# Patient Record
Sex: Female | Born: 1935 | Race: White | Hispanic: No | Marital: Married | Smoking: Former smoker
Health system: Southern US, Community
[De-identification: ages and names within clinical notes are randomized; demographics above are authoritative.]

## PROBLEM LIST (undated history)

## (undated) ENCOUNTER — Emergency Department (HOSPITAL_COMMUNITY): Discharge status: Absent without leave | Payer: Self-pay

## (undated) DIAGNOSIS — R112 Nausea with vomiting, unspecified: Secondary | ICD-10-CM

## (undated) DIAGNOSIS — I209 Angina pectoris, unspecified: Secondary | ICD-10-CM

## (undated) DIAGNOSIS — C801 Malignant (primary) neoplasm, unspecified: Secondary | ICD-10-CM

## (undated) DIAGNOSIS — M1990 Unspecified osteoarthritis, unspecified site: Secondary | ICD-10-CM

## (undated) DIAGNOSIS — I341 Nonrheumatic mitral (valve) prolapse: Secondary | ICD-10-CM

## (undated) DIAGNOSIS — Z9889 Other specified postprocedural states: Secondary | ICD-10-CM

## (undated) HISTORY — PX: CHOLECYSTECTOMY: SHX55

## (undated) HISTORY — PX: TONSILLECTOMY: SUR1361

## (undated) HISTORY — PX: ABDOMINAL HYSTERECTOMY: SHX81

## (undated) HISTORY — DX: Malignant (primary) neoplasm, unspecified: C80.1

## (undated) HISTORY — PX: BREAST SURGERY: SHX581

---

## 2004-07-23 ENCOUNTER — Ambulatory Visit: Payer: Self-pay | Admitting: Family Medicine

## 2004-07-29 ENCOUNTER — Ambulatory Visit (HOSPITAL_COMMUNITY): Admission: RE | Admit: 2004-07-29 | Discharge: 2004-07-29 | Payer: Self-pay | Admitting: Family Medicine

## 2011-09-27 ENCOUNTER — Encounter (HOSPITAL_COMMUNITY): Payer: Self-pay | Admitting: Pharmacy Technician

## 2011-10-03 NOTE — Patient Instructions (Addendum)
22 Lauren Bruce  10/03/2011   Your procedure is scheduled on:  10/11/2011  Report to [facility] at  815  AM.  Call this number if you have problems the morning of surgery: [phone]   Remember:   Do not eat food:After Midnight.  May have clear liquids:until Midnight .   Take these medicines the morning of surgery with A SIP OF WATER: none   Do not wear jewelry, make-up or nail polish.  Do not wear lotions, powders, or perfumes. You may wear deodorant.  Do not shave 48 hours prior to surgery. Men may shave face and neck.  Do not bring valuables to the hospital.  Contacts, dentures or bridgework may not be worn into surgery.  Leave suitcase in the car. After surgery it may be brought to your room.  For patients admitted to the hospital, checkout time is 11:00 AM the day of discharge.   Patients discharged the day of surgery will not be allowed to drive home.  Name and phone number of your driver: family  Special Instructions: CHG Shower Use Special Wash: 1/2 bottle night before surgery and 1/2 bottle morning of surgery.   Please read over the following fact sheets that you were given: Pain Booklet, MRSA Information, Surgical Site Infection Prevention, Anesthesia Post-op Instructions and Care and Recovery After Surgery

## 2011-10-03 NOTE — Patient Instructions (Addendum)
20 Lauren Bruce  10/03/2011   Your procedure is scheduled on:   10/11/2011   Report to [facility] at  815  AM.  Call this number if you have problems the morning of surgery: [phone]   Remember:   Do not eat food:After Midnight.  May have clear liquids:until Midnight .  Clear liquids include soda, tea, black coffee, apple or grape juice, broth.  Take these medicines the morning of surgery with A SIP OF WATER: none   Do not wear jewelry, make-up or nail polish.  Do not wear lotions, powders, or perfumes. You may wear deodorant.  Do not shave 48 hours prior to surgery. Men may shave face and neck.  Do not bring valuables to the hospital.  Contacts, dentures or bridgework may not be worn into surgery.  Leave suitcase in the car. After surgery it may be brought to your room.  For patients admitted to the hospital, checkout time is 11:00 AM the day of discharge.   Patients discharged the day of surgery will not be allowed to drive home.  Name and phone number of your driver: family  Special Instructions: CHG Shower Use Special Wash: 1/2 bottle night before surgery and 1/2 bottle morning of surgery.   Please read over the following fact sheets that you were given: Pain Booklet, MRSA Information, Surgical Site Infection Prevention, Anesthesia Post-op Instructions and Care and Recovery After Surgery Magnesium Citrate oral solution What is this medicine? MAGNESIUM CITRATE (mag NEE zee um SI treyt) is a saline laxative. It is used to treat occasional constipation, but it should not be used regularly for this purpose. This medicine may be used for other purposes; ask your health care provider or pharmacist if you have questions. What should I tell my health care provider before I take this medicine? They need to know if you have any of these conditions: -are on a low magnesium or low sodium diet -change in bowel habits for 2 weeks -colostomy or ileostomy -constipation after  using another laxative for 7 days -diabetes -kidney disease -rectal bleeding -stomach pain, nausea, or vomiting -an unusual or allergic reaction to magnesium citrate, other magnesium products, other medicines, foods, dyes, or preservatives -pregnant or trying to get pregnant -breast-feeding How should I use this medicine? Take this medicine by mouth. Follow the directions on the package or prescription label. Use a specially marked spoon or container to measure each dose. Ask your pharmacist if you do not have one. Household spoons are not accurate. Drink a full glass of fluid with each dose of this medicine. This medicine may taste better if it is chilled before you drink it. Do not take your medicine more often than directed. Talk to your pediatrician regarding the use of this medicine in children. While this drug may be prescribed for children as young as 40 years of age for selected conditions, precautions do apply. Overdosage: If you think you have taken too much of this medicine contact a poison control center or emergency room at once. NOTE: This medicine is only for you. Do not share this medicine with others. What if I miss a dose? This does not apply; this medicine is not for regular use. What may interact with this medicine? -cellulose sodium phosphate -digoxin -edetate disodium, EDTA -medicines for bone strength like etidronate, ibandronate, risedronate -sodium polystyrene sulfonate -some antibiotics like ciprofloxacin, doxycycline, gatifloxacin, levofloxacin, tetracycline -vitamin D This list may not describe all possible interactions.  Give your health care provider a list of all the medicines, herbs, non-prescription drugs, or dietary supplements you use. Also tell them if you smoke, drink alcohol, or use illegal drugs. Some items may interact with your medicine. What should I watch for while using this medicine? Tell your doctor or healthcare professional if your symptoms do  not start to get better or if they get worse. Do not take any other medicine by mouth within 2 hours of taking this medicine. What side effects may I notice from receiving this medicine? Side effects that you should report to your doctor or health care professional as soon as possible: -allergic reactions like skin rash, itching or hives, swelling of the face, lips, or tongue -breathing problems -chest pain -fast, irregular heartbeat -muscle weakness -nausea or vomiting Side effects that usually do not require medical attention (report to your doctor or health care professional if they continue or are bothersome): -diarrhea -stomach upset This list may not describe all possible side effects. Call your doctor for medical advice about side effects. You may report side effects to FDA at 1-800-FDA-1088. Where should I keep my medicine? Keep out of the reach of children. Store at room temperature or in the refrigerator between 8 and 30 degrees C (46 and 86 degrees F). Throw away any unused medicine 24 hours after opening the bottle. Throw away unopened bottles of medicine after the expiration date. NOTE: This sheet is a summary. It may not cover all possible information. If you have questions about this medicine, talk to your doctor, pharmacist, or health care provider.  2012, [employer]. (10/09/2007 5:27:50 PM) Rectocele/Enterocele Care After A woman's birth canal (vagina) can become weak or stretched. This can be caused by childbirth, heavy lifting, lasting (chronic) constipation, aging, or pelvic surgery. When the vagina is weak and stretched, parts of the intestine can bulge into the vagina by pushing against the vaginal walls. A rectocele is when the very end of the large intestine (rectum) causes the bulge. An enterocele is when the small intestine causes the bulge. Surgery to fix this problem is usually done through the vagina. If you just had this surgery, you were probably given a  drug to make you sleep (general anesthetic) or a drug that numbs you from the waist down (spinal/epidural). Here is what happened:  The small intestine or rectum was pushed back to its normal place.   The vaginal wall was made stronger. Sometimes this is done with stitches or a mesh-like material.  HOME CARE INSTRUCTIONS  Some women go home the same day as their surgery. Others stay in the hospital for a few days. This depends on the size and type of repair.  Pain and Medications  Some pain is normal after this surgery. Only take pain medicine your surgeon prescribed. Follow the directions carefully.   Do not take aspirin. It can cause bleeding.   Do not drink alcohol while taking pain medication.   You may be given a medicine (antibiotic) that kills germs. Follow the directions carefully.   Take warm sitz baths 2 times a day to control discomfort and reduce any swelling. Take sitz baths with your caregiver's permission.  Diet  Go back to your normal eating as directed by your caregiver.   Drink a lot of fluids. Drink at least 6 glasses of water every day.  Activity  Move around and walk as much as possible. This can keep blood clots from forming in your legs.   Do  not climb stairs until your caregiver says it is okay.   Do not lift objects 5 pounds (2.3 kg) or heavier. Do not bend or strain for 6 to 8 weeks.   Do not drive until after you stop taking pain medicine and your caregiver says it is okay.   Your return to work will depend on the type of work you do. Ask your caregiver what is best for you.   Ask your caregiver when you can resume sexual activity. Most women can start having sex in about 6 weeks after their surgery.   Get plenty of rest during the day and sleep at night.   Have someone help you with your household chores and activities for 3 to 4 weeks.  Other Precautions  You may have some discharge from the vagina for a few weeks after the surgery. It may have  small amounts of blood in it. This is normal. If you have questions, ask your caregiver.   Do not use tampons or douche.   You should be able to take a shower a day after your surgery. Do not take a tub bath for at least a week.   Take it easy for awhile. You should feel much better in 2 to 3 weeks. It may take up to 6 weeks to feel completely normal.   Keep all follow-up appointments.   Take your temperature twice a day and write it down.   Make sure your family understands everything about your surgery and recovery.  SEEK MEDICAL CARE IF:   You have any questions about your medication, or you need stronger pain medication.   Pain continues, even after taking pain medication.   You become constipated.   You have an oral temperature above 102 F (38.9 C).   You develop swelling and redness in the surgery area.   You become dizzy or lightheaded.   You feel sick to your stomach (nauseous), throw up (vomit), or have diarrhea.   You develop a rash.   You have a reaction to your medications.  SEEK IMMEDIATE MEDICAL CARE IF:   Pain gets worse.   You have new bleeding from your vagina.   Discharge from the vagina becomes heavy, or it has a bad smell.   You have an oral temperature above 102 F (38.9 C), not controlled by medicine.   You develop belly (abdominal) pain.   You develop chest pain.   You develop shortness of breath.   You pass out (faint).   You develop pain, swelling, or redness in the leg.   You have pain or burning with urination.   You have bloody urine or cannot urinate.  MAKE SURE YOU:   Understand these instructions.   Will watch your condition.   Will get help right away if you are not doing well or get worse.  Document Released: 06/15/2009 Document Revised: 03/10/2011 Document Reviewed: 06/15/2009 [facility] Patient Information 2012 [place], [place].Cystocele Repair A cystocele is a bulging, drooping hernia or break (rupture) of bladder  tissue into the birth canal (vagina). This bulging or rupture occurs on the top front wall of the vagina. CAUSES  Cystocele is associated with weakness of the top front wall of the vagina due to stretching and tearing of the ligaments and muscles in the area. This is often the result of:  Multiple childbirths.   Continuous heavy lifting.   Chronic cough from asthma, emphysema, or smoking.   Being overweight.   Changes from aging.  Previous surgery in the vaginal area.   Menopause with loss of estrogen hormone and weakening of the ligaments and muscles around the bladder.  SYMPTOMS   Uncontrolled loss of urine (incontinence) with cough, sneeze, or exercise.   Pelvic pressure.   Frequency or urgency to urinate because of inability to completely empty the bladder.   Bladder infections.   Needing to push on the upper vagina to help yourself pass urine.  DIAGNOSIS  A cystocele can be diagnosed by doing a pelvic exam and observing the top of the vagina drooping or bulging into or out of the vagina. TREATMENT  Surgical options:  Cystocele repair is surgery that removes the hernia.   There are also different "sling" operations that may be used.  Discuss the different types of surgeries to repair a cystocele with your caregiver. Your caregiver will decide what type of surgery will be best in your case. Nonsurgical options:  Kegel exercises. This helps strengthen and tighten the muscles and tissue in and around the bladder and vagina. This may help with mild cases of cystocele.   A pessary may help the cystocele. A pessary is a plastic or rubber device that lifts the bladder into place. A pessary must be fitted by a doctor.   Tampons or diaphragms that lift the bladder into place are sometimes helpful with a minor or small cystocele.   Estrogen may help with mild cases in menopausal and aging women.  LET YOUR CAREGIVER KNOW ABOUT:   Allergies to food or medicine.   Medicines  taken, including vitamins, herbs, eyedrops, over-the-counter medicines, and creams.   Use of steroids (by mouth or creams).   Previous problems with anesthetics or numbing medicines.   History of bleeding problems or blood clots.   Previous surgery.   Other health problems, including diabetes and kidney problems.   Possibility of pregnancy, if this applies.  RISKS AND COMPLICATIONS  All surgery is associated with risks.  There are risks with a general anesthesia. You should discuss this with your caregiver.   With spinal or epidural anesthesia, there may be an area that is not numbed, and you could feel pain.   Headache could occur with a spinal or epidural anesthetic.   The catheter you will have after surgery may not work properly or may get blocked and need to be replaced.   Excessive bleeding.   Infection.   Injury to surrounding structures.   Recurrence of the cystocele.   Surgery may not get rid of your symptoms.  BEFORE THE PROCEDURE   Do not take aspirin or blood thinners for 1 week prior to surgery, unless instructed otherwise.   Do not eat or drink anything after midnight the night before surgery.   Let your caregiver know if you develop a cold or other infectious problems prior to surgery.   If being admitted the day of surgery, you should be present 1 hour prior to your procedure or as directed by your caregiver.   Plan and arrange for help when you go home from the hospital.   If you smoke, do not smoke for at least 2 weeks before the surgery.   Do not drink any alcohol for 3 days before the surgery.  PROCEDURE  You will be given an anesthetic to prevent you from feeling pain during surgery. This may be a general anesthetic that puts you to sleep, or a spinal or epidural anesthetic. You will be asleep or be numbed through the entire procedure.  During cystocele repair, tissue is pulled from the sides and around the top of the vagina to lift up the hernia.  This removes the hernia so that the top of the vagina does not fall into the opening of the vagina. AFTER THE PROCEDURE  After surgery, you will be taken to the recovery room where a nurse will take care of you, checking your breathing, blood pressure, pulse, and your progress. When your caregiver feels you are stable, you will be taken to your room. You will have a drainage tube (Foley catheter) that will drain your bladder for 2 to 7 days or longer, until your bladder is working properly. This catheter is placed prior to surgery to help keep your bladder empty and out of the way during the procedure. After surgery, this will make passing your urine easier. The catheter will be removed when you can easily pass urine without this assistance. You may have gauze packing in the vagina that will be removed 1 to 2 days after the surgery. Usually, you will be given a medicine (antibiotic) that kills germs. You will be given pain medicine as needed. You can usually go home in 3 to 5 days. HOME CARE INSTRUCTIONS   Do not take baths. Take showers until your caregiver informs you otherwise.   Take antibiotics as directed by your caregiver.   Exercise as instructed. Do not perform exercises which increase the pressure inside your belly (abdomen), such as sit-ups or lifting weights, until your caregiver has given permission. Walking exercise is preferred.   Only take over-the-counter or prescription medicines for pain and discomfort as directed by your caregiver.   Do not drink alcohol while taking pain medicine.   Do not lift anything over 5 pounds.   Do not drive until your caregiver gives you permission.   Get plenty of rest and sleep.   Have someone help with your household chores for 1 to 2 weeks.   If you develop constipation, you may take a mild laxative with your caregiver's permission. Eating bran foods and drinking enough water and fluids to keep your urine clear or pale yellow helps with  constipation.   Do not take aspirin. It may cause bleeding.   You may resume normal diet and unstrenuous activities as directed.   Do not douche, use tampons, or engage in intercourse until your surgeon has given permission.   Change bandages (dressings) as directed.   Make and keep all your postoperative appointments.  SEEK MEDICAL CARE IF:   You have abnormal vaginal discharge.   You develop a rash.   You are having a reaction to your medicine.   You develop nausea or vomiting.  SEEK IMMEDIATE MEDICAL CARE IF:   You have redness, swelling, or increasing pain in the vaginal area.   You notice pus coming from the vagina.   You have a fever.   You notice a bad smell coming from the vagina.   You have increasing abdominal pain.   You have frequent urination or you notice burning during urination.   You notice blood in your urine.   You have excessive vaginal bleeding.   You cannot urinate.  MAKE SURE YOU:   Understand these instructions.   Will watch your condition.   Will get help right away if you are not doing well or get worse.  Document Released: 03/18/2000 Document Revised: 03/10/2011 Document Reviewed: 06/18/2009 [facility] Patient Information 2012 [place], [place].Hysterectomy Information  A hysterectomy is a procedure where your  uterus is surgically removed. It will no longer be possible to have menstrual periods or to become pregnant. The tubes and ovaries can be removed (bilateral salpingo-oopherectomy) during this surgery as well.  REASONS FOR A HYSTERECTOMY  Persistent, abnormal bleeding.   Lasting (chronic) pelvic pain or infection.   The lining of the uterus (endometrium) starts growing outside the uterus (endometriosis).   The endometrium starts growing in the muscle of the uterus (adenomyosis).   The uterus falls down into the vagina (pelvic organ prolapse).   Symptomatic uterine fibroids.   Precancerous cells.   Cervical cancer or uterine  cancer.  TYPES OF HYSTERECTOMIES  Supracervical hysterectomy. This type removes the top part of the uterus, but not the cervix.   Total hysterectomy. This type removes the uterus and cervix.   Radical hysterectomy. This type removes the uterus, cervix, and the fibrous tissue that holds the uterus in place in the pelvis (parametrium).  WAYS A HYSTERECTOMY CAN BE PERFORMED  Abdominal hysterectomy. A large surgical cut (incision) is made in the abdomen. The uterus is removed through this incision.   Vaginal hysterectomy. An incision is made in the vagina. The uterus is removed through this incision. There are no abdominal incisions.   Conventional laparoscopic hysterectomy. A thin, lighted tube with a camera (laparoscope) is inserted into 3 or 4 small incisions in the abdomen. The uterus is cut into small pieces. The small pieces are removed through the incisions, or they are removed through the vagina.   Laparoscopic assisted vaginal hysterectomy (LAVH). Three or four small incisions are made in the abdomen. Part of the surgery is performed laparoscopically and part vaginally. The uterus is removed through the vagina.   Robot-assisted laparoscopic hysterectomy. A laparoscope is inserted into 3 or 4 small incisions in the abdomen. A computer-controlled device is used to give the surgeon a 3D image. This allows for more precise movements of surgical instruments. The uterus is cut into small pieces and removed through the incisions or removed through the vagina.  RISKS OF HYSTERECTOMY   Bleeding and risk of blood transfusion. Tell your caregiver if you do not want to receive any blood products.   Blood clots in the legs or lung.   Infection.   Injury to surrounding organs.   Anesthesia problems or side effects.   Conversion to an abdominal hysterectomy.  WHAT TO EXPECT AFTER A HYSTERECTOMY  You will be given pain medicine.   You will need to have someone with you for the first 3 to 5  days after you go home.   You will need to follow up with your surgeon in 2 to 4 weeks after surgery to evaluate your progress.   You may have early menopause symptoms like hot flashes, night sweats, and insomnia.   If you had a hysterectomy for a problem that was not a cancer or a condition that could lead to cancer, then you no longer need Pap tests. However, even if you no longer need a Pap test, a regular exam is a good idea to make sure no other problems are starting.  Document Released: 09/14/2000 Document Revised: 03/10/2011 Document Reviewed: 10/30/2010 [facility] Patient Information 2012 [place], [place].PATIENT INSTRUCTIONS POST-ANESTHESIA  IMMEDIATELY FOLLOWING SURGERY:  Do not drive or operate machinery for the first twenty four hours after surgery.  Do not make any important decisions for twenty four hours after surgery or while taking narcotic pain medications or sedatives.  If you develop intractable nausea and vomiting or a severe  headache please notify your doctor immediately.  FOLLOW-UP:  Please make an appointment with your surgeon as instructed. You do not need to follow up with anesthesia unless specifically instructed to do so.  WOUND CARE INSTRUCTIONS (if applicable):  Keep a dry clean dressing on the anesthesia/puncture wound site if there is drainage.  Once the wound has quit draining you may leave it open to air.  Generally you should leave the bandage intact for twenty four hours unless there is drainage.  If the epidural site drains for more than 36-48 hours please call the anesthesia department.  QUESTIONS?:  Please feel free to call your physician or the hospital operator if you have any questions, and they will be happy to assist you.

## 2011-10-04 ENCOUNTER — Other Ambulatory Visit: Payer: Self-pay | Admitting: Obstetrics and Gynecology

## 2011-10-04 ENCOUNTER — Encounter (HOSPITAL_COMMUNITY): Payer: Self-pay

## 2011-10-04 ENCOUNTER — Encounter (HOSPITAL_COMMUNITY)
Admission: RE | Admit: 2011-10-04 | Discharge: 2011-10-04 | Disposition: A | Discharge status: Home / Self Care | Payer: Medicare Other | Source: Ambulatory Visit | Attending: Obstetrics and Gynecology | Admitting: Obstetrics and Gynecology

## 2011-10-04 HISTORY — DX: Nausea with vomiting, unspecified: R11.2

## 2011-10-04 HISTORY — DX: Other specified postprocedural states: Z98.890

## 2011-10-04 HISTORY — DX: Unspecified osteoarthritis, unspecified site: M19.90

## 2011-10-04 HISTORY — DX: Nonrheumatic mitral (valve) prolapse: I34.1

## 2011-10-04 LAB — BASIC METABOLIC PANEL
Calcium: 9.9 mg/dL (ref 8.4–10.5)
Chloride: 102 mEq/L (ref 96–112)
Creatinine, Ser: 0.94 mg/dL (ref 0.50–1.10)
GFR calc non Af Amer: 58 mL/min — ABNORMAL LOW (ref >90)
Glucose, Bld: 88 mg/dL (ref 70–99)
Potassium: 4.8 mEq/L (ref 3.5–5.1)

## 2011-10-04 LAB — SURGICAL PCR SCREEN
MRSA, PCR: NEGATIVE
Staphylococcus aureus: NEGATIVE

## 2011-10-04 LAB — CBC
Hemoglobin: 13.2 g/dL (ref 12.0–15.0)
MCH: 30.3 pg (ref 26.0–34.0)
Platelets: 289 10*3/uL (ref 150–400)
WBC: 7.2 10*3/uL (ref 4.0–10.5)

## 2011-10-04 LAB — DIFFERENTIAL
Basophils Absolute: 0 10*3/uL (ref 0.0–0.1)
Eosinophils Absolute: 0.2 10*3/uL (ref 0.0–0.7)
Eosinophils Relative: 3 % (ref 0–5)
Monocytes Absolute: 0.5 10*3/uL (ref 0.1–1.0)
Monocytes Relative: 7 % (ref 3–12)
Neutrophils Relative %: 50 % (ref 43–77)

## 2011-10-08 LAB — TYPE AND SCREEN
ABO/RH(D): A POS
Antibody Screen: NEGATIVE

## 2011-10-11 ENCOUNTER — Encounter (HOSPITAL_COMMUNITY): Payer: Self-pay | Admitting: Anesthesiology

## 2011-10-11 ENCOUNTER — Encounter (HOSPITAL_COMMUNITY)
Admission: RE | Disposition: A | Discharge status: Home / Self Care | Payer: Self-pay | Source: Ambulatory Visit | Attending: Obstetrics and Gynecology

## 2011-10-11 ENCOUNTER — Ambulatory Visit (HOSPITAL_COMMUNITY)
Admission: RE | Admit: 2011-10-11 | Discharge: 2011-10-12 | Disposition: A | Discharge status: Home / Self Care | Payer: Medicare Other | Source: Ambulatory Visit | Attending: Obstetrics and Gynecology | Admitting: Obstetrics and Gynecology

## 2011-10-11 ENCOUNTER — Inpatient Hospital Stay (HOSPITAL_COMMUNITY): Payer: Medicare Other | Admitting: Anesthesiology

## 2011-10-11 ENCOUNTER — Encounter (HOSPITAL_COMMUNITY): Payer: Self-pay

## 2011-10-11 ENCOUNTER — Encounter (HOSPITAL_COMMUNITY): Payer: Self-pay | Admitting: *Deleted

## 2011-10-11 DIAGNOSIS — IMO0002 Reserved for concepts with insufficient information to code with codable children: Secondary | ICD-10-CM

## 2011-10-11 DIAGNOSIS — Z01812 Encounter for preprocedural laboratory examination: Secondary | ICD-10-CM | POA: Insufficient documentation

## 2011-10-11 DIAGNOSIS — N814 Uterovaginal prolapse, unspecified: Secondary | ICD-10-CM | POA: Insufficient documentation

## 2011-10-11 DIAGNOSIS — N8189 Other female genital prolapse: Principal | ICD-10-CM | POA: Insufficient documentation

## 2011-10-11 DIAGNOSIS — Z01810 Encounter for preprocedural cardiovascular examination: Secondary | ICD-10-CM | POA: Insufficient documentation

## 2011-10-11 HISTORY — PX: SALPINGOOPHORECTOMY: SHX82

## 2011-10-11 HISTORY — PX: VAGINAL HYSTERECTOMY: SHX2639

## 2011-10-11 HISTORY — PX: ANTERIOR AND POSTERIOR REPAIR: SHX5121

## 2011-10-11 SURGERY — HYSTERECTOMY, VAGINAL
Anesthesia: Spinal | Laterality: Right | Wound class: Clean Contaminated

## 2011-10-11 MED ORDER — FENTANYL CITRATE 0.05 MG/ML IJ SOLN
25.0000 ug | INTRAMUSCULAR | Status: DC | PRN
  Administered 2011-10-11 (×2): 50 ug via INTRAVENOUS

## 2011-10-11 MED ORDER — FENTANYL CITRATE 0.05 MG/ML IJ SOLN
INTRAMUSCULAR | Status: AC
  Filled 2011-10-11: qty 2

## 2011-10-11 MED ORDER — PANTOPRAZOLE SODIUM 40 MG PO TBEC
40.0000 mg | DELAYED_RELEASE_TABLET | Freq: Every day | ORAL | Status: DC
  Administered 2011-10-11: 40 mg via ORAL
  Filled 2011-10-11: qty 1

## 2011-10-11 MED ORDER — FENTANYL CITRATE 0.05 MG/ML IJ SOLN
INTRAMUSCULAR | Status: DC | PRN
  Administered 2011-10-11: 25 ug via INTRATHECAL

## 2011-10-11 MED ORDER — CEFAZOLIN SODIUM-DEXTROSE 2-3 GM-% IV SOLR: 2.0000 g | INTRAVENOUS | Status: DC

## 2011-10-11 MED ORDER — BUPIVACAINE IN DEXTROSE 0.75-8.25 % IT SOLN
INTRATHECAL | Status: DC | PRN
  Administered 2011-10-11: 13 mg via INTRATHECAL

## 2011-10-11 MED ORDER — BUPIVACAINE HCL (PF) 0.5 % IJ SOLN
INTRAMUSCULAR | Status: DC | PRN
  Administered 2011-10-11: 1 mL
  Administered 2011-10-11: 16 mL

## 2011-10-11 MED ORDER — KETOROLAC TROMETHAMINE 30 MG/ML IJ SOLN: 30.0000 mg | Freq: Four times a day (QID) | INTRAMUSCULAR | Status: AC

## 2011-10-11 MED ORDER — CEFAZOLIN SODIUM 1-5 GM-% IV SOLN: 1.0000 g | INTRAVENOUS | Status: DC

## 2011-10-11 MED ORDER — ZOLPIDEM TARTRATE 5 MG PO TABS
5.0000 mg | ORAL_TABLET | Freq: Every evening | ORAL | Status: DC | PRN
Start: 2011-10-11 — End: 2011-10-12

## 2011-10-11 MED ORDER — KETOROLAC TROMETHAMINE 30 MG/ML IJ SOLN
INTRAMUSCULAR | Status: AC
  Administered 2011-10-11: 30 mg via INTRAVENOUS
  Filled 2011-10-11: qty 1

## 2011-10-11 MED ORDER — EPHEDRINE SULFATE 50 MG/ML IJ SOLN
INTRAMUSCULAR | Status: AC
  Filled 2011-10-11: qty 1

## 2011-10-11 MED ORDER — HYDROMORPHONE HCL PF 1 MG/ML IJ SOLN
0.2000 mg | INTRAMUSCULAR | Status: DC | PRN
  Administered 2011-10-11: 0.5 mg via INTRAVENOUS
  Administered 2011-10-12: 0.4 mg via INTRAVENOUS
  Filled 2011-10-11 (×2): qty 1

## 2011-10-11 MED ORDER — ONDANSETRON HCL 4 MG PO TABS: 4.0000 mg | ORAL_TABLET | Freq: Four times a day (QID) | ORAL | Status: DC | PRN

## 2011-10-11 MED ORDER — 0.9 % SODIUM CHLORIDE (POUR BTL) OPTIME
TOPICAL | Status: DC | PRN
  Administered 2011-10-11: 1000 mL

## 2011-10-11 MED ORDER — KCL IN DEXTROSE-NACL 20-5-0.45 MEQ/L-%-% IV SOLN
INTRAVENOUS | Status: DC
  Administered 2011-10-11: 125 mL/h via INTRAVENOUS
  Administered 2011-10-11: via INTRAVENOUS

## 2011-10-11 MED ORDER — EPHEDRINE SULFATE 50 MG/ML IJ SOLN
INTRAMUSCULAR | Status: DC | PRN
  Administered 2011-10-11: 5 mg via INTRAVENOUS
  Administered 2011-10-11: 10 mg via INTRAVENOUS

## 2011-10-11 MED ORDER — PROPOFOL 10 MG/ML IV EMUL
INTRAVENOUS | Status: DC | PRN
  Administered 2011-10-11: 25 ug/kg/min via INTRAVENOUS

## 2011-10-11 MED ORDER — DEXAMETHASONE SODIUM PHOSPHATE 4 MG/ML IJ SOLN
INTRAMUSCULAR | Status: AC
  Administered 2011-10-11: 4 mg via INTRAVENOUS
  Filled 2011-10-11: qty 1

## 2011-10-11 MED ORDER — MIDAZOLAM HCL 2 MG/2ML IJ SOLN
1.0000 mg | INTRAMUSCULAR | Status: DC | PRN
  Administered 2011-10-11: 1 mg via INTRAVENOUS

## 2011-10-11 MED ORDER — KETOROLAC TROMETHAMINE 30 MG/ML IJ SOLN
30.0000 mg | Freq: Four times a day (QID) | INTRAMUSCULAR | Status: AC
  Administered 2011-10-11 – 2011-10-12 (×3): 30 mg via INTRAVENOUS
  Filled 2011-10-11 (×3): qty 1

## 2011-10-11 MED ORDER — DOCUSATE SODIUM 100 MG PO CAPS
100.0000 mg | ORAL_CAPSULE | Freq: Two times a day (BID) | ORAL | Status: DC
  Administered 2011-10-12: 100 mg via ORAL
  Filled 2011-10-11 (×2): qty 1

## 2011-10-11 MED ORDER — BUPIVACAINE IN DEXTROSE 0.75-8.25 % IT SOLN
INTRATHECAL | Status: AC
  Filled 2011-10-11: qty 2

## 2011-10-11 MED ORDER — ONDANSETRON HCL 4 MG/2ML IJ SOLN: 4.0000 mg | Freq: Once | INTRAMUSCULAR | Status: DC | PRN

## 2011-10-11 MED ORDER — STERILE WATER FOR IRRIGATION IR SOLN
Status: DC | PRN
  Administered 2011-10-11: 1000 mL

## 2011-10-11 MED ORDER — PROPOFOL 10 MG/ML IV EMUL
INTRAVENOUS | Status: AC
  Filled 2011-10-11: qty 20

## 2011-10-11 MED ORDER — OXYCODONE-ACETAMINOPHEN 5-325 MG PO TABS: 1.0000 | ORAL_TABLET | ORAL | Status: DC | PRN

## 2011-10-11 MED ORDER — BUPIVACAINE HCL (PF) 0.5 % IJ SOLN
INTRAMUSCULAR | Status: AC
  Filled 2011-10-11: qty 30

## 2011-10-11 MED ORDER — ONDANSETRON HCL 4 MG/2ML IJ SOLN
4.0000 mg | Freq: Once | INTRAMUSCULAR | Status: AC
  Administered 2011-10-11: 4 mg via INTRAVENOUS

## 2011-10-11 MED ORDER — LIDOCAINE HCL (PF) 1 % IJ SOLN
INTRAMUSCULAR | Status: AC
  Filled 2011-10-11: qty 5

## 2011-10-11 MED ORDER — ONDANSETRON HCL 4 MG/2ML IJ SOLN
INTRAMUSCULAR | Status: AC
  Administered 2011-10-11: 4 mg via INTRAVENOUS
  Filled 2011-10-11: qty 2

## 2011-10-11 MED ORDER — MIDAZOLAM HCL 2 MG/2ML IJ SOLN
INTRAMUSCULAR | Status: AC
  Filled 2011-10-11: qty 2

## 2011-10-11 MED ORDER — CLOTRIMAZOLE-BETAMETHASONE 1-0.05 % EX CREA: 1.0000 "application " | TOPICAL_CREAM | Freq: Two times a day (BID) | CUTANEOUS | Status: DC | PRN

## 2011-10-11 MED ORDER — ONDANSETRON HCL 4 MG/2ML IJ SOLN
4.0000 mg | Freq: Four times a day (QID) | INTRAMUSCULAR | Status: DC | PRN
  Administered 2011-10-11 – 2011-10-12 (×2): 4 mg via INTRAVENOUS
  Filled 2011-10-11 (×2): qty 2

## 2011-10-11 MED ORDER — KETOROLAC TROMETHAMINE 30 MG/ML IJ SOLN
30.0000 mg | Freq: Once | INTRAMUSCULAR | Status: AC
  Administered 2011-10-11: 30 mg via INTRAVENOUS

## 2011-10-11 MED ORDER — CEFAZOLIN SODIUM 1-5 GM-% IV SOLN
INTRAVENOUS | Status: DC | PRN
  Administered 2011-10-11: 2 g via INTRAVENOUS

## 2011-10-11 MED ORDER — LACTATED RINGERS IV SOLN
INTRAVENOUS | Status: DC
  Administered 2011-10-11: 11:00:00 via INTRAVENOUS
  Administered 2011-10-11: 1000 mL via INTRAVENOUS

## 2011-10-11 MED ORDER — SIMETHICONE 80 MG PO CHEW: 80.0000 mg | CHEWABLE_TABLET | Freq: Four times a day (QID) | ORAL | Status: DC | PRN

## 2011-10-11 MED ORDER — DEXAMETHASONE SODIUM PHOSPHATE 4 MG/ML IJ SOLN
4.0000 mg | Freq: Once | INTRAMUSCULAR | Status: AC
  Administered 2011-10-11: 4 mg via INTRAVENOUS

## 2011-10-11 MED ORDER — CEFAZOLIN SODIUM-DEXTROSE 2-3 GM-% IV SOLR
INTRAVENOUS | Status: AC
  Filled 2011-10-11: qty 50

## 2011-10-11 SURGICAL SUPPLY — 40 items
BAG HAMPER (MISCELLANEOUS) ×3 IMPLANT
BLADE SURG ROTATE 9660 (MISCELLANEOUS) IMPLANT
CLOTH BEACON ORANGE TIMEOUT ST (SAFETY) ×3 IMPLANT
COVER LIGHT HANDLE STERIS (MISCELLANEOUS) ×6 IMPLANT
COVER MAYO STAND XLG (DRAPE) ×3 IMPLANT
DECANTER SPIKE VIAL GLASS SM (MISCELLANEOUS) ×3 IMPLANT
DRAPE PROXIMA HALF (DRAPES) ×3 IMPLANT
DRAPE STERI URO 9X17 APER PCH (DRAPES) ×3 IMPLANT
ELECT REM PT RETURN 9FT ADLT (ELECTROSURGICAL) ×3
ELECTRODE REM PT RTRN 9FT ADLT (ELECTROSURGICAL) ×2 IMPLANT
FORMALIN 10 PREFIL 480ML (MISCELLANEOUS) ×3 IMPLANT
GAUZE PACKING 2X5 YD STERILE (GAUZE/BANDAGES/DRESSINGS) ×3 IMPLANT
GLOVE ECLIPSE 6.5 STRL STRAW (GLOVE) ×3 IMPLANT
GLOVE ECLIPSE 7.0 STRL STRAW (GLOVE) ×3 IMPLANT
GLOVE ECLIPSE 9.0 STRL (GLOVE) ×3 IMPLANT
GLOVE INDICATOR 6.5 STRL GRN (GLOVE) ×3 IMPLANT
GLOVE INDICATOR 7.5 STRL GRN (GLOVE) ×3 IMPLANT
GLOVE INDICATOR STER SZ 9 (GLOVE) ×3 IMPLANT
GOWN STRL REIN 3XL LVL4 (GOWN DISPOSABLE) ×3 IMPLANT
GOWN STRL REIN XL XLG (GOWN DISPOSABLE) ×6 IMPLANT
IV NS IRRIG 3000ML ARTHROMATIC (IV SOLUTION) ×3 IMPLANT
KIT ROOM TURNOVER AP CYSTO (KITS) ×3 IMPLANT
MANIFOLD NEPTUNE II (INSTRUMENTS) ×3 IMPLANT
NEEDLE HYPO 25X1 1.5 SAFETY (NEEDLE) ×3 IMPLANT
NS IRRIG 1000ML POUR BTL (IV SOLUTION) ×3 IMPLANT
PACK PERI GYN (CUSTOM PROCEDURE TRAY) ×3 IMPLANT
PAD ARMBOARD 7.5X6 YLW CONV (MISCELLANEOUS) ×3 IMPLANT
SET BASIN LINEN APH (SET/KITS/TRAYS/PACK) ×3 IMPLANT
SET IV ADMIN VERSALIGHT (MISCELLANEOUS) ×3 IMPLANT
SUT CHROMIC 0 CT 1 (SUTURE) ×30 IMPLANT
SUT CHROMIC 2 0 CT 1 (SUTURE) ×12 IMPLANT
SUT CHROMIC GUT BROWN 0 54 (SUTURE) IMPLANT
SUT CHROMIC GUT BROWN 0 54IN (SUTURE)
SUT PROLENE 2 0 SH 30 (SUTURE) ×3 IMPLANT
SUT VIC AB 0 CT2 8-18 (SUTURE) ×3 IMPLANT
SYR CONTROL 10ML LL (SYRINGE) ×3 IMPLANT
TRAY FOLEY BAG SILVER LF 14FR (CATHETERS) IMPLANT
TRAY FOLEY CATH 14FR (SET/KITS/TRAYS/PACK) ×3 IMPLANT
VERSALIGHT (MISCELLANEOUS) ×3 IMPLANT
WATER STERILE IRR 1000ML POUR (IV SOLUTION) ×3 IMPLANT

## 2011-10-11 NOTE — Anesthesia Procedure Notes (Addendum)
Date/Time: 10/11/2011 8:07 AM Performed by: Despina Hidden   Spinal  Patient location during procedure: OR Start time: 10/11/2011 8:07 AM Staffing CRNA/Resident: Joycelyn Man J Preanesthetic Checklist Completed: patient identified, site marked, surgical consent, pre-op evaluation, timeout performed, IV checked, risks and benefits discussed and monitors and equipment checked Spinal Block Patient position: right lateral decubitus Prep: Betadine Patient monitoring: heart rate, cardiac monitor, continuous pulse ox and blood pressure Approach: right paramedian Location: L2-3 Injection technique: single-shot Needle Needle type: Spinocan  Needle gauge: 22 G Assessment Sensory level: T8 Additional Notes CSF clear and brisk     [id number]       09/2012

## 2011-10-11 NOTE — Op Note (Signed)
See the detailed operative notes included in the brief operative

## 2011-10-11 NOTE — Progress Notes (Signed)
Dr. Emelda Fear notified of pt's c/o ringworm to posterior left thigh, and that pt applied antifungal cream to affected area.  No new orders received.  Will cont to monitor.

## 2011-10-11 NOTE — H&P (Signed)
Lauren Bruce is an 78 y.o. female. She is admitted for Vaginal hysterectomy, Anterior and Posterior repair for pelvic relaxation with second degree uterine descensus and cystocele.  Pelvic ultrasound shows a tiny uterus, with a thin endometrial stripe 1.9 mm thickness, no suspicion of uterine pathology, tiny atrophic ovaries, and no abnormalities other than support issues.  She denies stress incontinence.  Pertinent Gynecological History: Menses: post-menopausal Bleeding: none Contraception: none DES exposure: unknown Blood transfusions: none Sexually transmitted diseases: no past history Previous GYN Procedures: none  Last mammogram: done thru Dr Lauren Bruce office Date: last yr. Last pap: normal Date: March 2013, Dr nyland OB History: G4, P4   Menstrual History: Menarche age: n/a No LMP recorded.    Past Medical History  Diagnosis Date  . PONV (postoperative nausea and vomiting)   . Mitral valve prolapse   . Skin cancer of face   . Arthritis     Past Surgical History  Procedure Date  . Tonsillectomy   . Breast surgery     benign, right, [place], [place]  . Cholecystectomy     [place], [place]    No family history on file.  Social History:  reports that she has quit smoking. She does not have any smokeless tobacco history on file. She reports that she does not drink alcohol or use illicit drugs.  Allergies:  Allergies  Allergen Reactions  . Demerol (Meperidine) Shortness Of Breath  . Xylocaine (Lidocaine) Shortness Of Breath    No prescriptions prior to admission    ROS  There were no vitals taken for this visit. Physical Exam Physical Examination: General appearance - alert, well appearing, and in no distress, oriented to person, place, and time, normal appearing weight and well hydrated Mental status - alert, oriented to person, place, and time, normal mood, behavior, speech, dress, motor activity, and thought processes Chest - clear to auscultation, no  wheezes, rales or rhonchi, symmetric air entry Abdomen - soft, nontender, nondistended, no masses or organomegaly Pelvic - VULVA: normal appearing vulva with no masses, tenderness or lesions, VAGINA: normal appearing vagina with normal color and discharge, no lesions, PELVIC FLOOR EXAM: cystocele to introitus, uterine descensus grade 2, CERVIX: normal appearing cervix without discharge or lesions, descensus , UTERUS: mobile, atrophic 2 descensus, ADNEXA: normal adnexa in size, nontender and no masses, RECTAL: normal rectal, no masses, posterior vaginal support better than anterior , will reinforce to aid support to anterior and apical surgery. Extremities - Homan's sign negative bilaterally No results found for this or any previous visit (from the past 24 hour(s)).  No results found.  Assessment/Plan: Pelvic relaxation with cystocele, uterine descensus second degree,  Plan:  Vaginal hysterectomy anterior and posterior repair.  [name] V 10/11/2011, 3:21 AM

## 2011-10-11 NOTE — Anesthesia Postprocedure Evaluation (Signed)
  Anesthesia Post-op Note  Patient: Lauren Bruce  Procedure(s) Performed: Procedure(s) (LRB): HYSTERECTOMY VAGINAL (N/A) ANTERIOR (CYSTOCELE) AND POSTERIOR REPAIR (RECTOCELE) (N/A) SALPINGO OOPHERECTOMY (Right)  Patient Location: PACU  Anesthesia Type: Spinal  Level of Consciousness: awake, alert , oriented and patient cooperative  Airway and Oxygen Therapy: Patient Spontanous Breathing  Post-op Pain: none  Post-op Assessment: Post-op Vital signs reviewed, Patient's Cardiovascular Status Stable, Respiratory Function Stable, Patent Airway, No signs of Nausea or vomiting and Pain level controlled  Post-op Vital Signs: Reviewed and stable  Complications: No apparent anesthesia complications

## 2011-10-11 NOTE — Anesthesia Preprocedure Evaluation (Signed)
Anesthesia Evaluation  Patient identified by MRN, date of birth, ID band Patient awake    Reviewed: Allergy & Precautions, H&P , Patient's Chart, lab work & pertinent test results  History of Anesthesia Complications (+) PONV  Airway Mallampati: I      Dental  (+) Teeth Intact and Implants   Pulmonary neg pulmonary ROS, former smoker,  breath sounds clear to auscultation        Cardiovascular + Valvular Problems/Murmurs MVP Rhythm:Regular     Neuro/Psych    GI/Hepatic   Endo/Other    Renal/GU      Musculoskeletal   Abdominal   Peds  Hematology   Anesthesia Other Findings   Reproductive/Obstetrics                           Anesthesia Physical Anesthesia Plan  ASA: II  Anesthesia Plan: Spinal   Post-op Pain Management:    Induction:   Airway Management Planned: Nasal Cannula  Additional Equipment:   Intra-op Plan:   Post-operative Plan:   Informed Consent: I have reviewed the patients History and Physical, chart, labs and discussed the procedure including the risks, benefits and alternatives for the proposed anesthesia with the patient or authorized representative who has indicated his/her understanding and acceptance.     Plan Discussed with:   Anesthesia Plan Comments:         Anesthesia Quick Evaluation

## 2011-10-11 NOTE — Anesthesia Postprocedure Evaluation (Addendum)
  Anesthesia Post-op Note  Patient: PHOENYX MELKA  Procedure(s) Performed: Procedure(s) (LRB): HYSTERECTOMY VAGINAL (N/A) ANTERIOR (CYSTOCELE) AND POSTERIOR REPAIR (RECTOCELE) (N/A) SALPINGO OOPHERECTOMY (Right)  Patient Location: PACU  Anesthesia Type: Spinal  Level of Consciousness: awake, alert , oriented and patient cooperative  Airway and Oxygen Therapy: Patient Spontanous Breathing  Post-op Pain: none  Post-op Assessment: Post-op Vital signs reviewed, Patient's Cardiovascular Status Stable, Respiratory Function Stable, Patent Airway and Pain level controlled  Post-op Vital Signs: Reviewed and stable  Complications: No apparent anesthesia complications 10/12/11  Patient doing well.  Pain controlled.  No apparent anesthesia complications.

## 2011-10-11 NOTE — Brief Op Note (Signed)
10/11/2011  10:18 AM  PATIENT:  Lauren Bruce  78 y.o. female  PRE-OPERATIVE DIAGNOSIS:  cystocele pelvic relaxation  POST-OPERATIVE DIAGNOSIS:  cystocele pelvic relaxation, uterine descensus second degree  PROCEDURE:  Procedure(s) (LRB): HYSTERECTOMY VAGINAL (N/A) ANTERIOR (CYSTOCELE) AND POSTERIOR REPAIR (RECTOCELE) (N/A) SALPINGO OOPHERECTOMY (Right)  SURGEON:  Surgeon(s) and Role:    * Tilda Burrow, MD - Primary  PHYSICIAN ASSISTANT:   ASSISTANTS: Irena Reichmann RN, D Blackwell CST   ANESTHESIA:   local and spinal  EBL:  Total I/O In: 900 [I.V.:900] Out: -   BLOOD ADMINISTERED:none  DRAINS: Urinary Catheter (Foley)   LOCAL MEDICATIONS USED:  MARCAINE    and Amount: 13 ml  SPECIMEN:  Source of Specimen:  uterus, cervix, Right tube and ovary, vaginal epithelium  DISPOSITION OF SPECIMEN:  PATHOLOGY  COUNTS:  YES  TOURNIQUET:  * No tourniquets in log *  DICTATION: .Dragon Dictation She was taken to the operating room prepped and draped for vaginal procedure after spinal anesthesia introduced timeout was conducted and procedure confirmed by involved surgical team. Ancef was administered preoperatively in length placed in high lithotomy support. The patient had some range her bowel prep which required cleansing prior to prepping and draping. A vaginal tape was placed in the rectum at the onset of the procedure. Cervix was grasped with single-tooth tenaculum, retracted inferiorly and and posterior colpotomy incision performed without difficulty the cervix extended past the introitus. Her cycle ligaments were clamped cut and suture ligated using Zeppelin clamps Mayo scissors and 0 chromics suture ligature which was tagged. Short speculum was replaced with the long curved weighted speculum, extending into the cul-de-sac. The cervix was circumscribed through the epithelium and the bladder retracted anteriorly and the anterior cervicovaginal reflection of peritoneum easily  identified and entered. Lower cardinal ligaments were taken down in small bites clamping cutting and suture ligating using Zeppelin clamps Mayo scissors and 0 chromics suture ligature. Broad ligaments were taken down similarly in small bites reaching to the level of the ligament utero-ovarian ligament complex, which could be crossclamped and tagged. The right tube and ovary could be easily isolated, while bowel was held away by moist vaginal tapes inside the abdomen. The right infundibulopelvic ligament was clamped cut and suture ligated and tagged for hemostasis and inspection. The left tube and ovary were more adherent to the pelvic sidewall and could not be isolated adequately for extraction. It was no abnormality suspected on the side. Bladder was drained, clear urine obtained 300 cc, and the vesicouterine reflection of peritoneum attached to the cul-de-sac. Prior to closure of the cuff 0 Prolene suture was used attaching the medial side of the uterosacral ligament to the pouch of Douglas each side, not crossing the midline,in order to reduce the potential for subsequent enterocele. Once peritoneum was closed anterior repair was initiated.  Anterior repair consisted of splitting vaginal epithelium underneath the large cystocele, peeling the bladder away, shortening the vaginal epithelium under the cystocele with 0 Vicryl mattress sutures x3, trimming redundant epithelium and using a running 2-0 chromic suture to reapproximate the tissue in the midline and then using 0 chromic to attach the anterior margin of the cystocele to the vaginal cuff with good tissue approximation achieved.  Posterior repair: Posterior repair consisted of grasping the perineal body it with Allis clamps, identifying a post Patrick laceration defect to the right of the midline, splitting the epithelium in the midline and exposing the tube and beneath the epithelium on side sufficiently to do a  site-specific repair which consisted of 3  sutures of 0 Vicryl pulling the tissue on the patient's left side to the right and cephalad 2 more reapproximate original anatomy. Redundant epithelium was trimmed and 2-0 chromic used to close the posterior defect. Patient tolerated procedure well with 100 cc EBL and went to recovery room in good condition with sponge and needle counts correct.  PLAN OF CARE: Admit for overnight observation  PATIENT DISPOSITION:  PACU - hemodynamically stable.   Delay start of Pharmacological VTE agent (>24hrs) due to surgical blood loss or risk of bleeding: not applicable

## 2011-10-11 NOTE — Addendum Note (Signed)
Addendum  created 10/11/11 1028 by Despina Hidden, CRNA   Modules edited:Notes Section

## 2011-10-11 NOTE — Transfer of Care (Signed)
Immediate Anesthesia Transfer of Care Note  Patient: Lauren Bruce  Procedure(s) Performed: Procedure(s) (LRB): HYSTERECTOMY VAGINAL (N/A) ANTERIOR (CYSTOCELE) AND POSTERIOR REPAIR (RECTOCELE) (N/A) SALPINGO OOPHERECTOMY (Right)  Patient Location: PACU  Anesthesia Type: Spinal  Level of Consciousness: awake and patient cooperative  Airway & Oxygen Therapy: Patient Spontanous Breathing and Patient connected to face mask oxygen  Post-op Assessment: Report given to PACU RN and Post -op Vital signs reviewed and stable  Post vital signs: Reviewed and stable  Complications: No apparent anesthesia complications

## 2011-10-12 LAB — CBC
HCT: 33.2 % — ABNORMAL LOW (ref 36.0–46.0)
Hemoglobin: 11.1 g/dL — ABNORMAL LOW (ref 12.0–15.0)
MCH: 30.1 pg (ref 26.0–34.0)
MCHC: 33.4 g/dL (ref 30.0–36.0)
MCV: 90 fL (ref 78.0–100.0)
RDW: 12.3 % (ref 11.5–15.5)

## 2011-10-12 LAB — BASIC METABOLIC PANEL
BUN: 7 mg/dL (ref 6–23)
Calcium: 9 mg/dL (ref 8.4–10.5)
Creatinine, Ser: 0.78 mg/dL (ref 0.50–1.10)
GFR calc Af Amer: >90 mL/min (ref >90)
GFR calc non Af Amer: 80 mL/min — ABNORMAL LOW (ref >90)
Glucose, Bld: 135 mg/dL — ABNORMAL HIGH (ref 70–99)
Potassium: 4.3 mEq/L (ref 3.5–5.1)

## 2011-10-12 MED ORDER — HYDROCODONE-ACETAMINOPHEN 5-500 MG PO TABS: 1.0000 | ORAL_TABLET | ORAL | Status: AC | PRN

## 2011-10-12 MED ORDER — DSS 100 MG PO CAPS: 100.0000 mg | ORAL_CAPSULE | Freq: Two times a day (BID) | ORAL | Status: AC

## 2011-10-12 NOTE — Addendum Note (Signed)
Addendum  created 10/12/11 0841 by Moshe Salisbury, CRNA   Modules edited:Notes Section

## 2011-10-12 NOTE — Addendum Note (Signed)
Addendum  created 10/12/11 0839 by Moshe Salisbury, CRNA   Modules edited:Notes Section

## 2011-10-12 NOTE — Addendum Note (Signed)
Addendum  created 10/12/11 0849 by Moshe Salisbury, CRNA   Modules edited:Notes Section

## 2011-10-12 NOTE — Discharge Summary (Signed)
Physician Discharge Summary  Patient ID: Lauren Bruce MRN: [id number] DOB/AGE: [birth year] 78 y.o.  Admit date: 10/11/2011 Discharge date: 10/12/2011  Admission Diagnoses:Active Problems:  Pelvic relaxation due to cystocele   Discharge Diagnoses:  Active Problems:  Pelvic relaxation due to cystocele   Discharged Condition: good  Hospital Course: Lauren Bruce is an 78 y.o. female. She is admitted for Vaginal hysterectomy, Anterior and Posterior repair for pelvic relaxation with second degree uterine descensus and cystocele.    Consults: None  Significant Diagnostic Studies: labs:  Hemoglobin & Hematocrit     Component Value Date/Time   HGB 11.1* 10/12/2011 0637   HCT 33.2* 10/12/2011 4098    Treatments: surgery:  PROCEDURE: Procedure(s) (LRB):  HYSTERECTOMY VAGINAL (N/A)  ANTERIOR (CYSTOCELE) AND POSTERIOR REPAIR (RECTOCELE) (N/A)  SALPINGO OOPHERECTOMY (Right)  Discharge Exam: Blood pressure 92/50, pulse 68, temperature 97.5 F (36.4 C), temperature source Oral, resp. rate 17, height 5\' 4"  (1.626 m), weight 70.6 kg (155 lb 10.3 oz), SpO2 98.00%. General appearance: alert, cooperative, appears stated age and no distress Chest wall: lungs clear GI: soft, non-tender; bowel sounds normal; no masses,  no organomegaly Extremities: Homans sign is negative, no sign of DVT  Disposition: Final discharge disposition not confirmed Discharge home  Medication List  As of 10/12/2011  8:45 AM   ASK your doctor about these medications         clotrimazole-betamethasone cream   Commonly known as: LOTRISONE   Apply 1 application topically 2 (two) times daily as needed. For ringworm      diphenhydrAMINE 25 MG tablet   Commonly known as: BENADRYL   Take 25 mg by mouth every 6 (six) hours as needed. For allergies      estrogens (conjugated) 0.625 MG tablet   Commonly known as: PREMARIN   Take 0.625 mg by mouth as directed. Until surgery. At bedtime      ibuprofen 200 MG tablet     Commonly known as: ADVIL,MOTRIN   Take 400 mg by mouth every 6 (six) hours as needed. For pain             Signed: Tilda Burrow 10/12/2011, 8:45 AM

## 2011-10-13 ENCOUNTER — Encounter (HOSPITAL_COMMUNITY): Payer: Self-pay | Admitting: Obstetrics and Gynecology

## 2013-03-06 ENCOUNTER — Encounter: Payer: Self-pay | Admitting: Obstetrics and Gynecology

## 2013-03-06 ENCOUNTER — Ambulatory Visit (INDEPENDENT_AMBULATORY_CARE_PROVIDER_SITE_OTHER): Payer: Medicare Other | Admitting: Obstetrics and Gynecology

## 2013-03-06 ENCOUNTER — Encounter (INDEPENDENT_AMBULATORY_CARE_PROVIDER_SITE_OTHER): Payer: Self-pay

## 2013-03-06 VITALS — BP 120/80 | Ht 63.0 in | Wt 156.0 lb

## 2013-03-06 DIAGNOSIS — Z01419 Encounter for gynecological examination (general) (routine) without abnormal findings: Secondary | ICD-10-CM

## 2013-03-06 DIAGNOSIS — Z124 Encounter for screening for malignant neoplasm of cervix: Secondary | ICD-10-CM

## 2013-03-06 LAB — HEMOCCULT GUIAC POC 1CARD (OFFICE)
Card #1 Date: 12032014
Fecal Occult Blood, POC: NEGATIVE

## 2013-03-06 MED ORDER — ZOLPIDEM TARTRATE 5 MG PO TABS: 5.0000 mg | ORAL_TABLET | Freq: Every evening | ORAL | Status: DC | PRN

## 2013-03-06 NOTE — Patient Instructions (Signed)
Dr nyland to see pt for routine annual concerns., including mammogram, and hemoccult Gyn anatomy visit q 3 yr.

## 2013-03-06 NOTE — Progress Notes (Signed)
   Assessment:  Annual Gyn Exam  good support s/p TVH ant and post repair Negative hemoccult Plan:  1. Pap not required s/ p hyst 2. return annually or prn 3    Annual mammogram advised Subjective:  Lauren Bruce is a 79 y.o. female No obstetric history on file. who presents for annual exam. Patient's last menstrual period was 10/04/2011. The patient has complaints today of left hip beginning to bother pt and keep her awake.   The following portions of the patient's history were reviewed and updated as appropriate: allergies, current medications, past family history, past medical history, past social history, past surgical history and problem list.  Review of Systems Constitutional: negative Gastrointestinal: negative Genitourinary:no sui sx  Objective:  BP 120/80  Ht 5\' 3"  (1.6 m)  Wt 156 lb (70.761 kg)  BMI 27.64 kg/m2  LMP 10/04/2011   BMI: Body mass index is 27.64 kg/(m^2).  General Appearance: Alert, appropriate appearance for age. No acute distress HEENT: Grossly normal Neck / Thyroid:  Cardiovascular: RRR; normal S1, S2, no murmur Lungs: CTA bilaterally Back: No CVAT Breast Exam: No masses or nodes.No dimpling, nipple retraction or discharge. Gastrointestinal: Soft, non-tender, no masses or organomegaly Pelvic Exam: Vaginal: normal mucosa without prolapse or lesions Cervix: absent Adnexa: non palpable Uterus: absent Rectal: good sphincter tone, no masses and guaiac negative Rectovaginal: normal rectal, no masses and guaiac negative stool obtained Lymphatic Exam: Non-palpable nodes in neck, clavicular, axillary, or inguinal regions Skin: no rash or abnormalities Neurologic: Normal gait and speech, no tremor  Psychiatric: Alert and oriented, appropriate affect.  Urinalysis:Not done  Christin Bach. MD Pgr [phone] 2:26 PM

## 2013-03-06 NOTE — Addendum Note (Signed)
Addended by: Tilda Burrow on: 03/06/2013 03:04 PM   Modules accepted: Orders

## 2014-09-18 DIAGNOSIS — E782 Mixed hyperlipidemia: Secondary | ICD-10-CM | POA: Insufficient documentation

## 2014-09-18 DIAGNOSIS — N390 Urinary tract infection, site not specified: Secondary | ICD-10-CM | POA: Insufficient documentation

## 2014-10-02 DIAGNOSIS — E785 Hyperlipidemia, unspecified: Secondary | ICD-10-CM | POA: Insufficient documentation

## 2015-06-29 ENCOUNTER — Emergency Department (HOSPITAL_COMMUNITY): Payer: Medicare HMO

## 2015-06-29 ENCOUNTER — Observation Stay (HOSPITAL_COMMUNITY)
Admission: EM | Admit: 2015-06-29 | Discharge: 2015-06-30 | Disposition: A | Discharge status: Home / Self Care | Payer: Medicare HMO | Attending: Internal Medicine | Admitting: Internal Medicine

## 2015-06-29 ENCOUNTER — Encounter (HOSPITAL_COMMUNITY): Payer: Self-pay | Admitting: Emergency Medicine

## 2015-06-29 DIAGNOSIS — I959 Hypotension, unspecified: Secondary | ICD-10-CM | POA: Diagnosis not present

## 2015-06-29 DIAGNOSIS — R531 Weakness: Secondary | ICD-10-CM | POA: Diagnosis not present

## 2015-06-29 DIAGNOSIS — W19XXXA Unspecified fall, initial encounter: Secondary | ICD-10-CM | POA: Diagnosis not present

## 2015-06-29 DIAGNOSIS — M1990 Unspecified osteoarthritis, unspecified site: Secondary | ICD-10-CM | POA: Insufficient documentation

## 2015-06-29 DIAGNOSIS — Z87891 Personal history of nicotine dependence: Secondary | ICD-10-CM | POA: Diagnosis not present

## 2015-06-29 DIAGNOSIS — R590 Localized enlarged lymph nodes: Secondary | ICD-10-CM | POA: Diagnosis present

## 2015-06-29 DIAGNOSIS — E869 Volume depletion, unspecified: Secondary | ICD-10-CM | POA: Diagnosis present

## 2015-06-29 DIAGNOSIS — Z85828 Personal history of other malignant neoplasm of skin: Secondary | ICD-10-CM | POA: Diagnosis not present

## 2015-06-29 DIAGNOSIS — R55 Syncope and collapse: Secondary | ICD-10-CM | POA: Diagnosis not present

## 2015-06-29 DIAGNOSIS — J069 Acute upper respiratory infection, unspecified: Secondary | ICD-10-CM | POA: Diagnosis not present

## 2015-06-29 DIAGNOSIS — R0789 Other chest pain: Secondary | ICD-10-CM | POA: Diagnosis present

## 2015-06-29 DIAGNOSIS — R079 Chest pain, unspecified: Secondary | ICD-10-CM

## 2015-06-29 HISTORY — DX: Angina pectoris, unspecified: I20.9

## 2015-06-29 LAB — CBC WITH DIFFERENTIAL/PLATELET
Basophils Absolute: 0 10*3/uL (ref 0.0–0.1)
Basophils Relative: 0 %
EOS ABS: 0 10*3/uL (ref 0.0–0.7)
EOS PCT: 0 %
HCT: 40.6 % (ref 36.0–46.0)
Hemoglobin: 14 g/dL (ref 12.0–15.0)
LYMPHS ABS: 0.9 10*3/uL (ref 0.7–4.0)
LYMPHS PCT: 27 %
MCH: 30.4 pg (ref 26.0–34.0)
MCHC: 34.5 g/dL (ref 30.0–36.0)
MCV: 88.1 fL (ref 78.0–100.0)
MONO ABS: 0.3 10*3/uL (ref 0.1–1.0)
MONOS PCT: 9 %
Neutro Abs: 2 10*3/uL (ref 1.7–7.7)
Neutrophils Relative %: 64 %
PLATELETS: 126 10*3/uL — AB (ref 150–400)
RBC: 4.61 MIL/uL (ref 3.87–5.11)
RDW: 12.3 % (ref 11.5–15.5)
WBC: 3.2 10*3/uL — AB (ref 4.0–10.5)

## 2015-06-29 LAB — URINALYSIS, ROUTINE W REFLEX MICROSCOPIC
GLUCOSE, UA: NEGATIVE mg/dL
HGB URINE DIPSTICK: NEGATIVE
KETONES UR: NEGATIVE mg/dL
LEUKOCYTES UA: NEGATIVE
Nitrite: NEGATIVE
PH: 5 (ref 5.0–8.0)
PROTEIN: 30 mg/dL — AB
Specific Gravity, Urine: 1.03 (ref 1.005–1.030)

## 2015-06-29 LAB — BASIC METABOLIC PANEL
Anion gap: 7 (ref 5–15)
BUN: 14 mg/dL (ref 6–20)
CO2: 26 mmol/L (ref 22–32)
CREATININE: 0.96 mg/dL (ref 0.44–1.00)
Calcium: 7.8 mg/dL — ABNORMAL LOW (ref 8.9–10.3)
Chloride: 103 mmol/L (ref 101–111)
GFR calc Af Amer: >60 mL/min (ref >60)
GFR, EST NON AFRICAN AMERICAN: 55 mL/min — AB (ref >60)
GLUCOSE: 116 mg/dL — AB (ref 65–99)
Potassium: 3.8 mmol/L (ref 3.5–5.1)
SODIUM: 136 mmol/L (ref 135–145)

## 2015-06-29 LAB — HEPATIC FUNCTION PANEL
ALBUMIN: 3.1 g/dL — AB (ref 3.5–5.0)
ALK PHOS: 39 U/L (ref 38–126)
ALT: 14 U/L (ref 14–54)
AST: 22 U/L (ref 15–41)
BILIRUBIN TOTAL: 0.3 mg/dL (ref 0.3–1.2)
Bilirubin, Direct: <0.1 mg/dL — ABNORMAL LOW (ref 0.1–0.5)
Total Protein: 5.2 g/dL — ABNORMAL LOW (ref 6.5–8.1)

## 2015-06-29 LAB — TROPONIN I

## 2015-06-29 LAB — URINE MICROSCOPIC-ADD ON

## 2015-06-29 LAB — D-DIMER, QUANTITATIVE: D-Dimer, Quant: 0.53 ug/mL-FEU — ABNORMAL HIGH (ref 0.00–0.50)

## 2015-06-29 MED ORDER — ACETAMINOPHEN 325 MG PO TABS: 650.0000 mg | ORAL_TABLET | Freq: Four times a day (QID) | ORAL | Status: DC | PRN

## 2015-06-29 MED ORDER — ONDANSETRON HCL 4 MG PO TABS: 4.0000 mg | ORAL_TABLET | Freq: Four times a day (QID) | ORAL | Status: DC | PRN

## 2015-06-29 MED ORDER — ENOXAPARIN SODIUM 40 MG/0.4ML ~~LOC~~ SOLN
40.0000 mg | SUBCUTANEOUS | Status: DC
  Administered 2015-06-29: 40 mg via SUBCUTANEOUS
  Filled 2015-06-29: qty 0.4

## 2015-06-29 MED ORDER — IBUPROFEN 400 MG PO TABS: 400.0000 mg | ORAL_TABLET | Freq: Four times a day (QID) | ORAL | Status: DC | PRN

## 2015-06-29 MED ORDER — SODIUM CHLORIDE 0.9 % IV BOLUS (SEPSIS)
1000.0000 mL | Freq: Once | INTRAVENOUS | Status: AC
  Administered 2015-06-29: 1000 mL via INTRAVENOUS

## 2015-06-29 MED ORDER — IOHEXOL 350 MG/ML SOLN
100.0000 mL | Freq: Once | INTRAVENOUS | Status: AC | PRN
  Administered 2015-06-29: 100 mL via INTRAVENOUS

## 2015-06-29 MED ORDER — SODIUM CHLORIDE 0.45 % IV SOLN
INTRAVENOUS | Status: DC
  Administered 2015-06-29 – 2015-06-30 (×2): via INTRAVENOUS

## 2015-06-29 MED ORDER — POLYETHYLENE GLYCOL 3350 17 G PO PACK: 17.0000 g | PACK | Freq: Every day | ORAL | Status: DC | PRN

## 2015-06-29 MED ORDER — ACETAMINOPHEN 650 MG RE SUPP
650.0000 mg | Freq: Four times a day (QID) | RECTAL | Status: DC | PRN
Start: 2015-06-29 — End: 2015-06-30

## 2015-06-29 MED ORDER — ONDANSETRON HCL 4 MG/2ML IJ SOLN: 4.0000 mg | Freq: Four times a day (QID) | INTRAMUSCULAR | Status: DC | PRN

## 2015-06-29 MED ORDER — SODIUM CHLORIDE 0.9 % IV SOLN: INTRAVENOUS | Status: DC

## 2015-06-29 MED ORDER — SODIUM CHLORIDE 0.9% FLUSH
3.0000 mL | Freq: Two times a day (BID) | INTRAVENOUS | Status: DC
  Administered 2015-06-29: 3 mL via INTRAVENOUS

## 2015-06-29 NOTE — ED Notes (Addendum)
Pt ambulated to the restroom with no problems.

## 2015-06-29 NOTE — ED Notes (Signed)
Pt also c/o SOB with congested cough, clear sputum production.

## 2015-06-29 NOTE — ED Provider Notes (Signed)
CSN: [id number]     Arrival date & time 06/29/15  1033 History  By signing my name below, I, Emmanuella Mensah, attest that this documentation has been prepared under the direction and in the presence of Ripley Fraise, MD. Electronically Signed: Judithann Sauger, ED Scribe. 06/29/2015. 12:17 PM.    Chief Complaint  Patient presents with  . Fall  . Hypotension   Patient is a 82 y.o. female presenting with fall. The history is provided by the patient. No language interpreter was used.  Fall This is a new problem. The current episode started 3 to 5 hours ago. The problem occurs rarely. The problem has not changed since onset.Associated symptoms include chest pain and shortness of breath. Pertinent negatives include no abdominal pain and no headaches. Nothing aggravates the symptoms. Nothing relieves the symptoms. She has tried nothing for the symptoms.   HPI Comments: Lauren Bruce is a 82 y.o. female with a hx of PONV, mitral valve prolapse, arthritis brought in by ambulance, who presents to the Emergency Department complaining of CP and SOB s/p fall that occurred today PTA. Pt explains that she does not remember falling but she had a "horrible" feeling and CP immediately prior to falling. She adds that she was unable to get up after the fall and her husband called EMS shortly after the fall. She denies any injuries or LOC during the fall. No alleviating factors noted. Pt has not tried any medications PTA. She admits that she has not been able to eat appropriately within the last week. She denies any hx of MI or stroke. Pt does not currently take any daily medications. She denies any HA, abdominal pain, hematemesis, urinary symptoms,  blood in stool, or dizziness.   PCP: Dr. Edrick Oh but does not follow up regularly.    Past Medical History  Diagnosis Date  . PONV (postoperative nausea and vomiting)   . Mitral valve prolapse   . Arthritis   . Cancer ([place])     skin cancer on her left eye   .  Anginal pain [facility])    Past Surgical History  Procedure Laterality Date  . Tonsillectomy    . Breast surgery      benign, right, [place], [place]  . Cholecystectomy      [place], [place]  . Vaginal hysterectomy  10/11/2011    Procedure: HYSTERECTOMY VAGINAL;  Surgeon: Jonnie Kind, MD;  Location: [facility];  Service: Gynecology;  Laterality: N/A;  . Anterior and posterior repair  10/11/2011    Procedure: ANTERIOR (CYSTOCELE) AND POSTERIOR REPAIR (RECTOCELE);  Surgeon: Jonnie Kind, MD;  Location: [facility];  Service: Gynecology;  Laterality: N/A;  . Salpingoophorectomy  10/11/2011    Procedure: SALPINGO OOPHERECTOMY;  Surgeon: Jonnie Kind, MD;  Location: [facility];  Service: Gynecology;  Laterality: Right;  . Abdominal hysterectomy     Family History  Problem Relation Age of Onset  . Heart disease Sister   . Diabetes Brother   . Heart disease Brother   . Cancer Daughter     throat    Social History  Substance Use Topics  . Smoking status: Former Research scientist (life sciences)  . Smokeless tobacco: Never Used  . Alcohol Use: No   OB History    Gravida Para Term Preterm AB TAB SAB Ectopic Multiple Living   4 4 4       3      Review of Systems  Respiratory: Positive for shortness of breath.   Cardiovascular: Positive for chest pain.  Gastrointestinal: Negative for abdominal pain and blood in stool.  Genitourinary: Negative for dysuria and hematuria.  Neurological: Negative for dizziness and headaches.  All other systems reviewed and are negative.     Allergies  Demerol and Xylocaine  Home Medications   Prior to Admission medications   Medication Sig Start Date End Date Taking? Authorizing Provider  diphenhydrAMINE (BENADRYL) 25 MG tablet Take 25 mg by mouth every 6 (six) hours as needed. For allergies    Historical Provider, MD  ibuprofen (ADVIL,MOTRIN) 200 MG tablet Take 400 mg by mouth every 6 (six) hours as needed. For pain    Historical Provider, MD  naproxen sodium (ALEVE) 220 MG tablet  Take 220 mg by mouth 2 (two) times daily as needed. pain    Historical Provider, MD  zolpidem (AMBIEN) 5 MG tablet Take 1 tablet (5 mg total) by mouth at bedtime as needed for sleep (use 1/2 to one tablet at bedtime for sleep). Patient not taking: Reported on 06/29/2015 03/06/13   Jonnie Kind, MD   BP 102/63 mmHg  Pulse 72  Temp(Src) 98.9 F (37.2 C) (Rectal)  Resp 17  Ht 5\' 3"  (1.6 m)  Wt 144 lb (65.318 kg)  BMI 25.51 kg/m2  SpO2 92%  LMP 10/04/2011 Physical Exam  Nursing note and vitals reviewed.  CONSTITUTIONAL: Well developed/well nourished HEAD: Normocephalic/atraumatic EYES: EOMI/PERRL ENMT: Mucous membranes moist NECK: supple no meningeal signs SPINE/BACK:entire spine nontender CV: S1/S2 noted, no murmurs/rubs/gallops noted LUNGS: Lungs are clear to auscultation bilaterally, no apparent distress ABDOMEN: soft, nontender, no rebound or guarding, bowel sounds noted throughout abdomen GU:no cva tenderness NEURO: Pt is awake/alert/appropriate, moves all extremitiesx4.  No facial droop.  No arm or leg drift noted.  EXTREMITIES: pulses normal/equal, full ROM, no signs of trauma ntoed SKIN: warm, color normal PSYCH: no abnormalities of mood noted, alert and oriented to situation  ED Course  Procedures  DIAGNOSTIC STUDIES: Oxygen Saturation is 93% on RA, adequate by my interpretation.    COORDINATION OF CARE: 12:01 PM- Pt advised of plan for treatment and pt agrees. Pt informed of her x-ray results. She will receive lab work for further evaluation.   3:40 PM Pt refuses to admit she had full syncope, but history c/w at least near syncope as she "felt horrible" had CP and fell to ground On re-eval she reported CP/BP that was sharp and worse with deep breathing Concern for possible PE as cause of near syncope, and she has had mild hypoxia despite negative CXR Will need CT chest after +d-dimer D/w dr Thurnell Garbe, to f/u on CT chest and then admit patient   Labs Review Labs  Reviewed  CBC WITH DIFFERENTIAL/PLATELET - Abnormal; Notable for the following:    WBC 3.2 (*)    Platelets 126 (*)    All other components within normal limits  BASIC METABOLIC PANEL - Abnormal; Notable for the following:    Glucose, Bld 116 (*)    Calcium 7.8 (*)    GFR calc non Af Amer 55 (*)    All other components within normal limits  URINALYSIS, ROUTINE W REFLEX MICROSCOPIC (NOT AT [facility]) - Abnormal; Notable for the following:    Bilirubin Urine SMALL (*)    Protein, ur 30 (*)    All other components within normal limits  URINE MICROSCOPIC-ADD ON - Abnormal; Notable for the following:    Squamous Epithelial / LPF 6-30 (*)    Bacteria, UA MANY (*)    All other components within  normal limits  D-DIMER, QUANTITATIVE (NOT AT [facility]) - Abnormal; Notable for the following:    D-Dimer, Quant 0.53 (*)    All other components within normal limits  TROPONIN I    Imaging Review Dg Chest 2 View  06/29/2015  CLINICAL DATA:  Cough for 2 weeks.  Recent fall EXAM: CHEST  2 VIEW COMPARISON:  July 29, 2004 FINDINGS: Lungs are hyperexpanded. There are scattered areas of scarring in each lower lung zone. There is no edema or consolidation. The heart size is normal. The pulmonary vascularity is stable and within normal limits. No adenopathy. No bone lesions. IMPRESSION: Lungs hyperexpanded. Areas of scarring in both lower lung zones. No edema or consolidation. Electronically Signed   By: Lowella Grip III M.D.   On: 06/29/2015 11:49   Ripley Fraise, MD has personally reviewed and evaluated these images and lab results as part of his medical decision-making.   EKG Interpretation   Date/Time:  Monday June 29 2015 10:54:01 EDT Ventricular Rate:  70 PR Interval:  165 QRS Duration: 78 QT Interval:  404 QTC Calculation: 436 R Axis:   91 Text Interpretation:  Sinus rhythm Right axis deviation No significant  change since last tracing Confirmed by Christy Gentles  MD, Elenore Rota ([id number]) on  06/29/2015  11:00:06 AM     Medications  sodium chloride 0.9 % bolus 1,000 mL (not administered)  sodium chloride 0.9 % bolus 1,000 mL (0 mLs Intravenous Stopped 06/29/15 1521)    MDM   Final diagnoses:  Syncope, unspecified syncope type  Chest pain, unspecified chest pain type    Nursing notes including past medical history and social history reviewed and considered in documentation xrays/imaging reviewed by myself and considered during evaluation Labs/vital reviewed myself and considered during evaluation    I personally performed the services described in this documentation, which was scribed in my presence. The recorded information has been reviewed and is accurate.       Ripley Fraise, MD 06/29/15 [phone]

## 2015-06-29 NOTE — H&P (Signed)
[facility] History and Physical  Lauren Bruce [id number] DOB: [birth year] DOA: 06/29/2015  Referring physician: Christy Gentles PCP: Sherrie Mustache, MD   Chief Complaint: Syncope/ fall  HPI: Lauren Bruce is a 82 y.o. female with no PMH on no prescription medications presenting with fall at home.  Patient reports a chest cold w chest / nasal congestion for several days to a week. Patient was eating today and began to feel very weak.  She doesn't remember falling but she felt "horrible" prior to falling.  Also had some chest pain.  Her husband called EMS.    On arrival BP was low in the 90's. Has not been eating or drinking well.  Received 2 L NS so far and BP up to 140 /80.  Pt feeling better.      ROS  denies CP  no joint pain   no HA  no blurry vision  no rash  no diarrhea  no nausea/ vomiting  no dysuria  no difficulty voiding  no change in urine color    Where does patient live home Can patient participate in ADLs? yes  Past Medical History  Past Medical History  Diagnosis Date  . PONV (postoperative nausea and vomiting)   . Mitral valve prolapse   . Arthritis   . Cancer ([place])     skin cancer on her left eye   . Anginal pain [facility])    Past Surgical History  Past Surgical History  Procedure Laterality Date  . Tonsillectomy    . Breast surgery      benign, right, [place], [place]  . Cholecystectomy      [place], [place]  . Vaginal hysterectomy  10/11/2011    Procedure: HYSTERECTOMY VAGINAL;  Surgeon: Jonnie Kind, MD;  Location: [facility];  Service: Gynecology;  Laterality: N/A;  . Anterior and posterior repair  10/11/2011    Procedure: ANTERIOR (CYSTOCELE) AND POSTERIOR REPAIR (RECTOCELE);  Surgeon: Jonnie Kind, MD;  Location: [facility];  Service: Gynecology;  Laterality: N/A;  . Salpingoophorectomy  10/11/2011    Procedure: SALPINGO OOPHERECTOMY;  Surgeon: Jonnie Kind, MD;  Location: [facility];  Service: Gynecology;  Laterality: Right;  . Abdominal  hysterectomy     Family History  Family History  Problem Relation Age of Onset  . Heart disease Sister   . Diabetes Brother   . Heart disease Brother   . Cancer Daughter     throat    Social History  reports that she has quit smoking. She has never used smokeless tobacco. She reports that she does not drink alcohol or use illicit drugs. Allergies  Allergies  Allergen Reactions  . Demerol [Meperidine] Shortness Of Breath  . Xylocaine [Lidocaine] Shortness Of Breath    Occurred with dental procedure using Lido and Epinephrine. Tolerated Marcaine at [facility] without problems   Home medications Prior to Admission medications   Medication Sig Start Date End Date Taking? Authorizing Provider  diphenhydrAMINE (BENADRYL) 25 MG tablet Take 25 mg by mouth every 6 (six) hours as needed. For allergies    Historical Provider, MD  ibuprofen (ADVIL,MOTRIN) 200 MG tablet Take 400 mg by mouth every 6 (six) hours as needed. For pain    Historical Provider, MD  naproxen sodium (ALEVE) 220 MG tablet Take 220 mg by mouth 2 (two) times daily as needed. pain    Historical Provider, MD  zolpidem (AMBIEN) 5 MG tablet Take 1 tablet (5 mg total) by mouth at bedtime as needed for sleep (  use 1/2 to one tablet at bedtime for sleep). Patient not taking: Reported on 06/29/2015 03/06/13   Jonnie Kind, MD   Liver Function Tests No results for input(s): AST, ALT, ALKPHOS, BILITOT, PROT, ALBUMIN in the last 168 hours. No results for input(s): LIPASE, AMYLASE in the last 168 hours. CBC  Recent Labs Lab 06/29/15 1234  WBC 3.2*  NEUTROABS 2.0  HGB 14.0  HCT 40.6  MCV 88.1  PLT 123XX123*   Basic Metabolic Panel  Recent Labs Lab 06/29/15 1227  NA 136  K 3.8  CL 103  CO2 26  GLUCOSE 116*  BUN 14  CREATININE 0.96  CALCIUM 7.8*     Filed Vitals:   06/29/15 1700 06/29/15 1730 06/29/15 1800 06/29/15 1830  BP: 123/69 132/69 132/91 102/76  Pulse: 67 67 67 71  Temp:      TempSrc:      Resp: 18 24 22 26    Height:      Weight:      SpO2: 94% 97% 93% 94%   Exam: Gen alert, elderly WF, WDWN, no distress, calm, a bit tired looking No rash, cyanosis or gangrene Sclera anicteric, throat clear and moist  No jvd or bruits Chest scattered rales/ wheezes , mostly clear RRR no MRG Abd soft ntnd no mass or ascites +bs GU defer MS no joint effusions or deformity Ext no LE or UE edema / no wounds or ulcers Neuro is alert, Ox 3 , nf   EKG (independently reviewed) > NSR , no acute changes, 88 CXR (independently reviewed) > hyperinflation, no edema/ consolidation  Home medications > benadryl, advil, Naprosyn, ambien, all prn  Na 136 CReat 0.96 WBC 3.2  Hb 14  plt 126      Assessment: 1. Fall/ prob syncope - suspect vasovagal/ vol depletion from recent chest cold. CXR neg, no evidence PNA clinically.  No fever here. CTA of chest no PE, no PNA. +Nonspecific hilar/ mediastinal LAN, needs thor surg referral after dc.  Plan admit, IVF"s, carotid dopplers and echo in am.  Telemetry.  2. Cough - prob URI, no indication for abx at this time.   3. Lymphadenopathy - needs thor surg referral after dc  Plan - admit OBS status, tele, echo/ carotid US am.  IVF"s rehydrate.    DVT Prophylaxis lovenox  Code Status: full  Family Communication: none here  Disposition Plan: home 1-2 days    Sol Blazing [facility] Pager [phone]  Cell [phone]  If 7PM-7AM, please contact night-coverage [email or web address] Password [facility] 06/29/2015, 8:07 PM

## 2015-06-29 NOTE — ED Provider Notes (Signed)
Pt received at sign out with CT-A chest pending and #2L IV NS infusing. CT without PE. BP improving after IVF. Dx and testing d/w pt and family.  Questions answered.  Verb understanding, agreeable to admit. 1730:  T/C to [facility] Dr. Jonnie Finner, case discussed, including:  HPI, pertinent PM/SHx, VS/PE, dx testing, ED course and treatment:  Agreeable to admit, requests to write temporary orders, obtain observation tele bed to team [facility].   Patient Vitals for the past 24 hrs:  BP Temp Temp src Pulse Resp SpO2 Height Weight  06/29/15 1730 132/69 mmHg - - 67 24 97 % - -  06/29/15 1700 123/69 mmHg - - 67 18 94 % - -  06/29/15 1647 127/79 mmHg - - 69 17 94 % - -  06/29/15 1615 116/62 mmHg - - 69 25 90 % - -  06/29/15 1600 110/63 mmHg - - 70 25 93 % - -  06/29/15 1545 108/56 mmHg - - 60 22 93 % - -  06/29/15 1530 104/59 mmHg - - 66 26 94 % - -  06/29/15 1515 90/75 mmHg - - 61 19 92 % - -  06/29/15 1500 105/89 mmHg - - 60 19 94 % - -  06/29/15 1430 115/68 mmHg - - 65 17 96 % - -  06/29/15 1415 104/69 mmHg - - (!) 58 21 93 % - -  06/29/15 1330 108/67 mmHg - - 69 17 90 % - -  06/29/15 1315 99/59 mmHg - - 70 24 91 % - -  06/29/15 1245 107/63 mmHg - - 66 19 91 % - -  06/29/15 1145 102/63 mmHg - - 72 17 92 % - -  06/29/15 1130 111/76 mmHg - - 72 25 93 % - -  06/29/15 1115 94/58 mmHg - - 71 20 92 % - -  06/29/15 1100 - 98.9 F (37.2 C) Rectal - - - - -  06/29/15 1100 103/67 mmHg - - 73 17 95 % - -  06/29/15 1055 105/69 mmHg - - 72 15 93 % 5\' 3"  (1.6 m) 144 lb (65.318 kg)      Ct Angio Chest Pe W/cm &/or Wo Cm 06/29/2015  CLINICAL DATA:  Patient with pleuritic chest pain and shortness of breath. Evaluate for pulmonary embolism. Recent fall. EXAM: CT ANGIOGRAPHY CHEST WITH CONTRAST TECHNIQUE: Multidetector CT imaging of the chest was performed using the standard protocol during bolus administration of intravenous contrast. Multiplanar CT image reconstructions and MIPs were obtained to evaluate the  vascular anatomy. CONTRAST:  166mL OMNIPAQUE IOHEXOL 350 MG/ML SOLN COMPARISON:  Chest radiograph 06/29/2015 FINDINGS: Mediastinum/Nodes: The thyroid is enlarged and heterogeneous. Multiple mediastinal lymph nodes are demonstrated including a 1.1 cm precarinal lymph node (image 30; series 7), a 1.7 cm subcarinal lymph node (image 44; series 7) and a 1.4 cm right hilar lymph node (image 45; series 7). Small hiatal hernia. Normal heart size. No pericardial effusion. Aorta and main pulmonary artery are normal in caliber. Adequate opacification of the pulmonary artery. No evidence for pulmonary embolism. Lungs/Pleura: Central airways are patent. There is thickening of the bronchi bilaterally. No large area of pulmonary consolidation. Subpleural ground-glass opacities within the right lower lobe and bandlike opacities within the left lower lobe. No pleural effusion or pneumothorax. Upper abdomen: Unremarkable Musculoskeletal: No aggressive or acute appearing osseous lesions. Review of the MIP images confirms the above findings. IMPRESSION: No evidence for pulmonary embolism. Nonspecific mediastinal and right hilar lymphadenopathy. Recommend thoracic surgical consultation as well as  follow-up chest CT with IV contrast in 3 months or PET-CT as clinically indicated. Bilateral lower lobe subpleural ground-glass opacities may represent atelectasis or infection. Electronically Signed   By: Lovey Newcomer M.D.   On: 06/29/2015 17:06     Francine Graven, DO 06/29/15 1744

## 2015-06-29 NOTE — ED Notes (Signed)
MD at bedside. 

## 2015-06-29 NOTE — ED Notes (Signed)
Pt brought in by EMS, called out for fall. On arrival pt was hypotensive, c/o chest discomfort. Family reported to EMS that pt had little intake, fluids or food, in the past week. CP reproducible with chest palpation. Per EMS pt has a cough.

## 2015-06-30 DIAGNOSIS — R55 Syncope and collapse: Secondary | ICD-10-CM | POA: Diagnosis not present

## 2015-06-30 LAB — CBC
HEMATOCRIT: 36.6 % (ref 36.0–46.0)
Hemoglobin: 12.3 g/dL (ref 12.0–15.0)
MCH: 29.6 pg (ref 26.0–34.0)
MCHC: 33.6 g/dL (ref 30.0–36.0)
MCV: 88 fL (ref 78.0–100.0)
Platelets: 120 10*3/uL — ABNORMAL LOW (ref 150–400)
RBC: 4.16 MIL/uL (ref 3.87–5.11)
RDW: 12.4 % (ref 11.5–15.5)
WBC: 2.7 10*3/uL — ABNORMAL LOW (ref 4.0–10.5)

## 2015-06-30 LAB — TSH: TSH: 1.516 u[IU]/mL (ref 0.350–4.500)

## 2015-06-30 MED ORDER — BENZONATATE 100 MG PO CAPS: 100.0000 mg | ORAL_CAPSULE | Freq: Three times a day (TID) | ORAL | Status: DC | PRN

## 2015-06-30 MED ORDER — GUAIFENESIN-DM 100-10 MG/5ML PO SYRP
5.0000 mL | ORAL_SOLUTION | Freq: Once | ORAL | Status: AC
  Administered 2015-06-30: 5 mL via ORAL
  Filled 2015-06-30: qty 5

## 2015-06-30 NOTE — Progress Notes (Signed)
Patient discharge home. Patient stable. Patient left in private vehicle with family.

## 2015-06-30 NOTE — Care Management (Signed)
Patient set up with PCP appointment for 2 week follow up Dr Maudie Mercury on April 14th at 2pm.. Discharge instructions updated and copy given to patient who expressed understanding.

## 2015-06-30 NOTE — Care Management Obs Status (Signed)
[place] NOTIFICATION   Patient Details  Name: LACANDICE GOGA MRN: [id number] Date of Birth: 1933/09/04   Medicare Observation Status Notification Given:  Yes    Alvie Heidelberg, RN 06/30/2015, 11:45 AM

## 2015-06-30 NOTE — Care Management Note (Signed)
Case Management Note  Patient Details  Name: Lauren Bruce MRN: [id number] Date of Birth: 27-Nov-1933  Subjective/Objective:          Spoke with patient who is alert and oriented from home with husband . No CM needs identified. Established PCP for patient.          Action/Plan: Home with Self care.   Expected Discharge Date:                  Expected Discharge Plan:  Home/Self Care  In-House Referral:     Discharge planning Services  CM Consult  Post Acute Care Choice:    Choice offered to:     DME Arranged:    DME Agency:     HH Arranged:    [place] Agency:     Status of Service:  Completed, signed off  Medicare Important Message Given:    Date Medicare IM Given:    Medicare IM give by:    Date Additional Medicare IM Given:    Additional Medicare Important Message give by:     If discussed at [place] of Stay Meetings, dates discussed:    Additional Comments:  Alvie Heidelberg, RN 06/30/2015, 12:24 PM

## 2015-06-30 NOTE — Progress Notes (Signed)
Patient states understanding of discharge instructions.  

## 2015-06-30 NOTE — Discharge Summary (Addendum)
Physician Discharge Summary  Lauren Bruce [id number] DOB: 11-15-1933 DOA: 06/29/2015  PCP: Sherrie Mustache, MD  Admit date: 06/29/2015 Discharge date: 06/30/2015  Time spent: 45 minutes  Recommendations for Outpatient Follow-up:  -We'll be discharge home today.  -Advised to follow-up with primary care provider in 2 weeks.   Discharge Diagnoses:  Principal Problem:   Syncope Active Problems:   Fall   URI (upper respiratory infection)   General weakness   Volume depletion   Thoracic lymphadenopathy   Discharge Condition: Stable and improved  Filed Weights   06/29/15 1055 06/29/15 2203  Weight: 65.318 kg (144 lb) 65.7 kg (144 lb 13.5 oz)    History of present illness:  As per Dr. Jonnie Finner on 3/27: Lauren Bruce is a 82 y.o. female with no PMH on no prescription medications presenting with fall at home. Patient reports a chest cold w chest / nasal congestion for several days to a week. Patient was eating today and began to feel very weak. She doesn't remember falling but she felt "horrible" prior to falling. Also had some chest pain. Her husband called EMS.   On arrival BP was low in the 90's. Has not been eating or drinking well. Received 2 L NS so far and BP up to 140 /80. Pt feeling better.  Hospital Course:   Fall/near syncope -Suspect etiology to be vasovagal/volume depletion from decreased oral intake due to recent upper respiratory infection. -BP has stabilize, she has been ambulating without issues. -Do not believe she needs echo or carotid ultrasounds, will discontinue. -CT chest was negative for PE, she did have mediastinal and hilar lymphadenopathy, likely related to acute suspected viral infection. -She requests cough medication, will prescribe Tessalon Perles upon discharge.  Rest of chronic issues have been stable   Procedures:  None   Consultations:  None  Discharge Instructions      Discharge Instructions    Increase  activity slowly    Complete by:  As directed             Medication List    STOP taking these medications        zolpidem 5 MG tablet  Commonly known as:  AMBIEN      TAKE these medications        ALEVE 220 MG tablet  Generic drug:  naproxen sodium  Take 220 mg by mouth 2 (two) times daily as needed. pain     benzonatate 100 MG capsule  Commonly known as:  TESSALON PERLES  Take 1 capsule (100 mg total) by mouth 3 (three) times daily as needed for cough.     diphenhydrAMINE 25 MG tablet  Commonly known as:  BENADRYL  Take 25 mg by mouth every 6 (six) hours as needed. For allergies     ibuprofen 200 MG tablet  Commonly known as:  ADVIL,MOTRIN  Take 400 mg by mouth every 6 (six) hours as needed. For pain       Allergies  Allergen Reactions  . Demerol [Meperidine] Shortness Of Breath  . Xylocaine [Lidocaine] Shortness Of Breath    Occurred with dental procedure using Lido and Epinephrine. Tolerated Marcaine at [facility] without problems   Follow-up Information    Follow up with Sherrie Mustache, MD. Schedule an appointment as soon as possible for a visit in 2 weeks.   Specialty:  Family Medicine   Contact information:   [address] [place] [address] [phone]        The  results of significant diagnostics from this hospitalization (including imaging, microbiology, ancillary and laboratory) are listed below for reference.    Significant Diagnostic Studies: Dg Chest 2 View  06/29/2015  CLINICAL DATA:  Cough for 2 weeks.  Recent fall EXAM: CHEST  2 VIEW COMPARISON:  July 29, 2004 FINDINGS: Lungs are hyperexpanded. There are scattered areas of scarring in each lower lung zone. There is no edema or consolidation. The heart size is normal. The pulmonary vascularity is stable and within normal limits. No adenopathy. No bone lesions. IMPRESSION: Lungs hyperexpanded. Areas of scarring in both lower lung zones. No edema or consolidation. Electronically Signed    By: Lowella Grip III M.D.   On: 06/29/2015 11:49   Ct Angio Chest Pe W/cm &/or Wo Cm  06/29/2015  CLINICAL DATA:  Patient with pleuritic chest pain and shortness of breath. Evaluate for pulmonary embolism. Recent fall. EXAM: CT ANGIOGRAPHY CHEST WITH CONTRAST TECHNIQUE: Multidetector CT imaging of the chest was performed using the standard protocol during bolus administration of intravenous contrast. Multiplanar CT image reconstructions and MIPs were obtained to evaluate the vascular anatomy. CONTRAST:  180mL OMNIPAQUE IOHEXOL 350 MG/ML SOLN COMPARISON:  Chest radiograph 06/29/2015 FINDINGS: Mediastinum/Nodes: The thyroid is enlarged and heterogeneous. Multiple mediastinal lymph nodes are demonstrated including a 1.1 cm precarinal lymph node (image 30; series 7), a 1.7 cm subcarinal lymph node (image 44; series 7) and a 1.4 cm right hilar lymph node (image 45; series 7). Small hiatal hernia. Normal heart size. No pericardial effusion. Aorta and main pulmonary artery are normal in caliber. Adequate opacification of the pulmonary artery. No evidence for pulmonary embolism. Lungs/Pleura: Central airways are patent. There is thickening of the bronchi bilaterally. No large area of pulmonary consolidation. Subpleural ground-glass opacities within the right lower lobe and bandlike opacities within the left lower lobe. No pleural effusion or pneumothorax. Upper abdomen: Unremarkable Musculoskeletal: No aggressive or acute appearing osseous lesions. Review of the MIP images confirms the above findings. IMPRESSION: No evidence for pulmonary embolism. Nonspecific mediastinal and right hilar lymphadenopathy. Recommend thoracic surgical consultation as well as follow-up chest CT with IV contrast in 3 months or PET-CT as clinically indicated. Bilateral lower lobe subpleural ground-glass opacities may represent atelectasis or infection. Electronically Signed   By: Lovey Newcomer M.D.   On: 06/29/2015 17:06     Microbiology: No results found for this or any previous visit (from the past 240 hour(s)).   Labs: Basic Metabolic Panel:  Recent Labs Lab 06/29/15 1227  NA 136  K 3.8  CL 103  CO2 26  GLUCOSE 116*  BUN 14  CREATININE 0.96  CALCIUM 7.8*   Liver Function Tests:  Recent Labs Lab 06/29/15 1423  AST 22  ALT 14  ALKPHOS 39  BILITOT 0.3  PROT 5.2*  ALBUMIN 3.1*   No results for input(s): LIPASE, AMYLASE in the last 168 hours. No results for input(s): AMMONIA in the last 168 hours. CBC:  Recent Labs Lab 06/29/15 1234 06/30/15 0636  WBC 3.2* 2.7*  NEUTROABS 2.0  --   HGB 14.0 12.3  HCT 40.6 36.6  MCV 88.1 88.0  PLT 126* 120*   Cardiac Enzymes:  Recent Labs Lab 06/29/15 1227  TROPONINI <0.03   BNP: BNP (last 3 results) No results for input(s): BNP in the last 8760 hours.  ProBNP (last 3 results) No results for input(s): PROBNP in the last 8760 hours.  CBG: No results for input(s): GLUCAP in the last 168 hours.  SignedLelon Frohlich  [facility] Pager: [phone] 06/30/2015, 11:04 AM

## 2016-03-08 ENCOUNTER — Other Ambulatory Visit: Payer: Self-pay | Admitting: Obstetrics and Gynecology

## 2016-03-09 ENCOUNTER — Other Ambulatory Visit: Payer: Medicare HMO | Admitting: Obstetrics and Gynecology

## 2016-03-09 ENCOUNTER — Encounter: Payer: Self-pay | Admitting: Obstetrics and Gynecology

## 2016-03-09 ENCOUNTER — Ambulatory Visit: Payer: Medicare HMO | Admitting: Obstetrics and Gynecology

## 2016-03-09 VITALS — BP 130/74 | HR 63 | Wt 141.2 lb

## 2016-03-09 DIAGNOSIS — N952 Postmenopausal atrophic vaginitis: Secondary | ICD-10-CM | POA: Diagnosis not present

## 2016-03-09 DIAGNOSIS — N8110 Cystocele, unspecified: Secondary | ICD-10-CM

## 2016-03-09 NOTE — Progress Notes (Signed)
[facility] Visit  03/09/2016         Patient name: Lauren Bruce MRN [id number]  Date of birth: 19-Aug-1933  CC & HPI:  Lauren Bruce is a 82 y.o. female presenting today for moderate vaginal pain. She also describes a pressure sensation in her vagina. She reports occasional associated urinary incontinence. No alleviating factors noted.   ROS:  ROS Otherwise negative for acute change except as noted in the HPI.  Pertinent History Reviewed:   Reviewed: Significant for cystocele  Medical         Past Medical History:  Diagnosis Date  . Anginal pain ([place])   . Arthritis   . Cancer ([place])    skin cancer on her left eye   . Mitral valve prolapse   . PONV (postoperative nausea and vomiting)                               Surgical Hx:    Past Surgical History:  Procedure Laterality Date  . ABDOMINAL HYSTERECTOMY    . ANTERIOR AND POSTERIOR REPAIR  10/11/2011   Procedure: ANTERIOR (CYSTOCELE) AND POSTERIOR REPAIR (RECTOCELE);  Surgeon: Jonnie Kind, MD;  Location: [facility];  Service: Gynecology;  Laterality: N/A;  . BREAST SURGERY     benign, right, [place], [place], [place]  . SALPINGOOPHORECTOMY  10/11/2011   Procedure: SALPINGO OOPHERECTOMY;  Surgeon: Jonnie Kind, MD;  Location: [facility];  Service: Gynecology;  Laterality: Right;  . TONSILLECTOMY    . VAGINAL HYSTERECTOMY  10/11/2011   Procedure: HYSTERECTOMY VAGINAL;  Surgeon: Jonnie Kind, MD;  Location: [facility];  Service: Gynecology;  Laterality: N/A;   Medications: Reviewed & Updated - see associated section                       Current Outpatient Prescriptions:  .  benzonatate (TESSALON PERLES) 100 MG capsule, Take 1 capsule (100 mg total) by mouth 3 (three) times daily as needed for cough. (Patient not taking: Reported on 03/09/2016), Disp: 20 capsule, Rfl: 0 .  diphenhydrAMINE (BENADRYL) 25 MG tablet, Take 25 mg by mouth every 6 (six) hours as needed. For allergies, Disp: , Rfl:   .  ibuprofen (ADVIL,MOTRIN) 200 MG tablet, Take 400 mg by mouth every 6 (six) hours as needed. For pain, Disp: , Rfl:  .  naproxen sodium (ALEVE) 220 MG tablet, Take 220 mg by mouth 2 (two) times daily as needed. pain, Disp: , Rfl:    Social History: Reviewed -  reports that she has quit smoking. She has never used smokeless tobacco.  Objective Findings:  Vitals: Blood pressure 130/74, pulse 63, weight 141 lb 3.2 oz (64 kg), last menstrual period 10/04/2011.  Physical Examination: General appearance - alert, well appearing, and in no distress Mental status - alert, oriented to person, place, and time Pelvic -  VULVA: normal appearing  VAGINA: atrophic vaginal tissue; small cystocele CERVIX: surgically absent,  UTERUS: surgically absent, vaginal cuff well healed,    Assessment & Plan:   A:  1. Atrophic vaginal tissue  2. Small cystocele   P:  1. Premarin vaginal cream, apply twice weekly. 2. Follow up in 6 weeks   By signing my name below, I, Evelene Croon, attest that this documentation has been prepared under the direction and in the presence of Jonnie Kind, MD .  Electronically Signed: Evelene Croon, Scribe. 03/09/2016. 12:38 PM. I personally performed the services described in this documentation, which was SCRIBED in my presence. The recorded information has been reviewed and considered accurate. It has been edited as necessary during review. Jonnie Kind, MD

## 2016-03-10 ENCOUNTER — Telehealth: Payer: Self-pay | Admitting: Obstetrics and Gynecology

## 2016-03-10 NOTE — Telephone Encounter (Signed)
Spoke with pt. Pt saw Dr. Glo Herring yesterday. Pt states Dr. Glo Herring was going to prescribe Estrogen twice a week. There is nothing at the pharmacy. Please advise. Thanks!! [place]

## 2016-03-15 ENCOUNTER — Telehealth: Payer: Self-pay | Admitting: Obstetrics and Gynecology

## 2016-03-15 MED ORDER — ESTROGENS, CONJUGATED 0.625 MG/GM VA CREA: 0.5000 g | TOPICAL_CREAM | VAGINAL | 12 refills | Status: DC

## 2016-03-15 NOTE — Telephone Encounter (Signed)
Pt aware med was sent to pharmacy. JSY °

## 2016-03-15 NOTE — Telephone Encounter (Signed)
Pt states she cannot afford the Premarin Cream, is going to cost her $300.00 out of pocket. Pt informed will route message to Dr. Glo Herring who will be in the office tomorrow and to see if he can prescribe an alternative med that would be cheaper. Pt verbalized understanding.

## 2016-03-15 NOTE — Telephone Encounter (Signed)
rx sent to pharmacy

## 2016-03-16 MED ORDER — ESTRADIOL 0.1 MG/GM VA CREA: 0.2500 | TOPICAL_CREAM | VAGINAL | 3 refills | Status: DC

## 2016-04-20 ENCOUNTER — Ambulatory Visit: Payer: Medicare HMO | Admitting: Obstetrics and Gynecology

## 2016-09-13 DIAGNOSIS — R82998 Other abnormal findings in urine: Secondary | ICD-10-CM | POA: Insufficient documentation

## 2018-12-31 ENCOUNTER — Ambulatory Visit: Payer: Medicare HMO | Admitting: Family Medicine

## 2019-02-12 ENCOUNTER — Ambulatory Visit (INDEPENDENT_AMBULATORY_CARE_PROVIDER_SITE_OTHER): Payer: Medicare HMO | Admitting: Family Medicine

## 2019-02-12 ENCOUNTER — Other Ambulatory Visit: Payer: Self-pay

## 2019-02-12 ENCOUNTER — Encounter: Payer: Self-pay | Admitting: Family Medicine

## 2019-02-12 VITALS — BP 131/75 | HR 75 | Temp 96.0°F | Ht 63.0 in | Wt 119.2 lb

## 2019-02-12 DIAGNOSIS — E785 Hyperlipidemia, unspecified: Secondary | ICD-10-CM

## 2019-02-12 DIAGNOSIS — R739 Hyperglycemia, unspecified: Secondary | ICD-10-CM | POA: Diagnosis not present

## 2019-02-12 DIAGNOSIS — G4700 Insomnia, unspecified: Secondary | ICD-10-CM | POA: Insufficient documentation

## 2019-02-12 DIAGNOSIS — F5104 Psychophysiologic insomnia: Secondary | ICD-10-CM | POA: Diagnosis not present

## 2019-02-12 LAB — BAYER DCA HB A1C WAIVED: HB A1C (BAYER DCA - WAIVED): 5.5 % (ref <7.0)

## 2019-02-12 MED ORDER — MIRTAZAPINE 15 MG PO TABS: 15.0000 mg | ORAL_TABLET | Freq: Every day | ORAL | 2 refills | Status: DC

## 2019-02-12 NOTE — Progress Notes (Signed)
BP 131/75   Pulse 75   Temp (!) 96 F (35.6 C) (Temporal)   Ht _0  (1.6 m)   Wt 119 lb 3.2 oz (54.1 kg)   LMP 10/04/2011   SpO2 96%   BMI 21.12 kg/m    Subjective:    Patient ID: Lauren Bruce, female    DOB: 07-Aug-1933, 85 y.o.   MRN: [id number]  HPI: Lauren Bruce is a 85 y.o. female presenting on 02/12/2019 for New Patient (Initial Visit) ([place]) and Establish Care   HPI Patient is coming in to establish care with our office today.  She really does not have a whole lot of health issues and says that she is doing really well for the most part. Patient denies any chest pain, shortness of breath, headaches or vision issues, abdominal complaints, diarrhea, nausea, vomiting, or joint issues.   She is not been sleeping as well and has been having a lot of decreased appetite and difficulty getting up and going and has been having a lot of these issues since her loss of her daughter who passed away just within the past year.  Relevant past medical, surgical, family and social history reviewed and updated as indicated. Interim medical history since our last visit reviewed. Allergies and medications reviewed and updated.  Review of Systems  Constitutional: Positive for unexpected weight change. Negative for chills and fever.  Eyes: Negative for visual disturbance.  Respiratory: Negative for chest tightness and shortness of breath.   Cardiovascular: Negative for chest pain and leg swelling.  Musculoskeletal: Negative for back pain and gait problem.  Skin: Negative for rash.  Neurological: Negative for light-headedness and headaches.  Psychiatric/Behavioral: Positive for dysphoric mood and sleep disturbance. Negative for agitation and behavioral problems. The patient is nervous/anxious.   All other systems reviewed and are negative.   Per HPI unless specifically indicated above  Social History   Socioeconomic History  . Marital status: Married    Spouse name: Not on file   . Number of children: Not on file  . Years of education: Not on file  . Highest education level: Not on file  Occupational History  . Not on file  Social Needs  . Financial resource strain: Not on file  . Food insecurity    Worry: Not on file    Inability: Not on file  . Transportation needs    Medical: Not on file    Non-medical: Not on file  Tobacco Use  . Smoking status: Former Research scientist (life sciences)  . Smokeless tobacco: Never Used  Substance and Sexual Activity  . Alcohol use: No  . Drug use: No  . Sexual activity: Never    Birth control/protection: Surgical  Lifestyle  . Physical activity    Days per week: Not on file    Minutes per session: Not on file  . Stress: Not on file  Relationships  . Social Herbalist on phone: Not on file    Gets together: Not on file    Attends religious service: Not on file    Active member of club or organization: Not on file    Attends meetings of clubs or organizations: Not on file    Relationship status: Not on file  . Intimate partner violence    Fear of current or ex partner: Not on file    Emotionally abused: Not on file    Physically abused: Not on file    Forced sexual activity: Not on file  Other Topics Concern  . Not on file  Social History Narrative  . Not on file    Past Surgical History:  Procedure Laterality Date  . ABDOMINAL HYSTERECTOMY    . ANTERIOR AND POSTERIOR REPAIR  10/11/2011   Procedure: ANTERIOR (CYSTOCELE) AND POSTERIOR REPAIR (RECTOCELE);  Surgeon: Jonnie Kind, MD;  Location: [facility];  Service: Gynecology;  Laterality: N/A;  . BREAST SURGERY     benign, right, [place], [place], [place]  . SALPINGOOPHORECTOMY  10/11/2011   Procedure: SALPINGO OOPHERECTOMY;  Surgeon: Jonnie Kind, MD;  Location: [facility];  Service: Gynecology;  Laterality: Right;  . TONSILLECTOMY    . VAGINAL HYSTERECTOMY  10/11/2011   Procedure: HYSTERECTOMY VAGINAL;  Surgeon: Jonnie Kind, MD;   Location: [facility];  Service: Gynecology;  Laterality: N/A;    Family History  Problem Relation Age of Onset  . Heart disease Sister   . Diabetes Brother   . Heart disease Brother   . Cancer Daughter        throat     Allergies as of 02/12/2019      Reactions   Demerol [meperidine] Shortness Of Breath   Xylocaine [lidocaine] Shortness Of Breath   Occurred with dental procedure using Lido and Epinephrine. Tolerated Marcaine at [facility] without problems      Medication List       Accurate as of February 12, 2019 10:17 AM. If you have any questions, ask your nurse or doctor.        STOP taking these medications   benzonatate 100 MG capsule Commonly known as: Best boy Stopped by: Fransisca Kaufmann Alyxander Kollmann, MD   estradiol 0.1 MG/GM vaginal cream Commonly known as: ESTRACE VAGINAL Stopped by: Fransisca Kaufmann Sadat Sliwa, MD     TAKE these medications   Aleve 220 MG tablet Generic drug: naproxen sodium Take 220 mg by mouth 2 (two) times daily as needed. pain   diphenhydrAMINE 25 MG tablet Commonly known as: BENADRYL Take 25 mg by mouth every 6 (six) hours as needed. For allergies   ibuprofen 200 MG tablet Commonly known as: ADVIL Take 400 mg by mouth every 6 (six) hours as needed. For pain          Objective:    BP 131/75   Pulse 75   Temp (!) 96 F (35.6 C) (Temporal)   Ht _0  (1.6 m)   Wt 119 lb 3.2 oz (54.1 kg)   LMP 10/04/2011   SpO2 96%   BMI 21.12 kg/m   Wt Readings from Last 3 Encounters:  02/12/19 119 lb 3.2 oz (54.1 kg)  03/09/16 141 lb 3.2 oz (64 kg)  06/29/15 144 lb 13.5 oz (65.7 kg)    Physical Exam Vitals signs and nursing note reviewed.  Constitutional:      General: She is not in acute distress.    Appearance: She is well-developed. She is not diaphoretic.  Eyes:     Conjunctiva/sclera: Conjunctivae normal.  Cardiovascular:     Rate and Rhythm: Normal rate and regular rhythm.     Heart sounds: Normal heart sounds. No murmur.  Pulmonary:      Effort: Pulmonary effort is normal. No respiratory distress.     Breath sounds: Normal breath sounds. No wheezing.  Musculoskeletal: Normal range of motion.        General: No tenderness.  Skin:    General: Skin is warm and dry.     Findings: No  rash.  Neurological:     Mental Status: She is alert and oriented to person, place, and time.     Coordination: Coordination normal.  Psychiatric:        Mood and Affect: Mood is anxious and depressed.        Behavior: Behavior normal.        Thought Content: Thought content does not include suicidal ideation. Thought content does not include suicidal plan.         Assessment & Plan:   Problem List Items Addressed This Visit      Other   Dyslipidemia - Primary   Relevant Orders   CBC with Differential/Platelet   Lipid panel   Insomnia   Relevant Medications   mirtazapine (REMERON) 15 MG tablet    Other Visit Diagnoses    Elevated blood sugar       Relevant Orders   hgba1c   CBC with Differential/Platelet   CMP14+EGFR    Patient is having decreased appetite and anxiety related a lot to her loss that she recently had of her daughter who passed away.  She has not been sleeping as well at night because of it and her appetite has gone down and she is losing weight.  We will try Remeron  Follow up plan: Return in about 3 months (around 05/15/2019), or if symptoms worsen or fail to improve, for insomnia and mood.  Caryl Pina, MD [facility] 02/12/2019, 10:17 AM

## 2019-02-13 LAB — CMP14+EGFR
ALT: 9 IU/L (ref 0–32)
AST: 14 IU/L (ref 0–40)
Albumin/Globulin Ratio: 1.9 (ref 1.2–2.2)
Albumin: 4.2 g/dL (ref 3.6–4.6)
Alkaline Phosphatase: 67 IU/L (ref 39–117)
BUN/Creatinine Ratio: 14 (ref 12–28)
BUN: 11 mg/dL (ref 8–27)
Bilirubin Total: 0.3 mg/dL (ref 0.0–1.2)
CO2: 25 mmol/L (ref 20–29)
Calcium: 9.6 mg/dL (ref 8.7–10.3)
Chloride: 100 mmol/L (ref 96–106)
Creatinine, Ser: 0.8 mg/dL (ref 0.57–1.00)
GFR calc Af Amer: 79 mL/min/{1.73_m2} (ref >59)
GFR calc non Af Amer: 68 mL/min/{1.73_m2} (ref >59)
Globulin, Total: 2.2 g/dL (ref 1.5–4.5)
Glucose: 96 mg/dL (ref 65–99)
Potassium: 5.2 mmol/L (ref 3.5–5.2)
Sodium: 137 mmol/L (ref 134–144)
Total Protein: 6.4 g/dL (ref 6.0–8.5)

## 2019-02-13 LAB — CBC WITH DIFFERENTIAL/PLATELET
Basophils Absolute: 0 10*3/uL (ref 0.0–0.2)
Basos: 1 %
EOS (ABSOLUTE): 0.1 10*3/uL (ref 0.0–0.4)
Eos: 1 %
Hematocrit: 39.4 % (ref 34.0–46.6)
Hemoglobin: 12.9 g/dL (ref 11.1–15.9)
Immature Grans (Abs): 0 10*3/uL (ref 0.0–0.1)
Immature Granulocytes: 0 %
Lymphocytes Absolute: 2 10*3/uL (ref 0.7–3.1)
Lymphs: 27 %
MCH: 29.1 pg (ref 26.6–33.0)
MCHC: 32.7 g/dL (ref 31.5–35.7)
MCV: 89 fL (ref 79–97)
Monocytes Absolute: 0.7 10*3/uL (ref 0.1–0.9)
Monocytes: 9 %
Neutrophils Absolute: 4.9 10*3/uL (ref 1.4–7.0)
Neutrophils: 62 %
Platelets: 347 10*3/uL (ref 150–450)
RBC: 4.43 x10E6/uL (ref 3.77–5.28)
RDW: 12.3 % (ref 11.7–15.4)
WBC: 7.7 10*3/uL (ref 3.4–10.8)

## 2019-02-13 LAB — LIPID PANEL
Chol/HDL Ratio: 4.5 ratio — ABNORMAL HIGH (ref 0.0–4.4)
Cholesterol, Total: 227 mg/dL — ABNORMAL HIGH (ref 100–199)
HDL: 51 mg/dL (ref >39)
LDL Chol Calc (NIH): 150 mg/dL — ABNORMAL HIGH (ref 0–99)
Triglycerides: 147 mg/dL (ref 0–149)
VLDL Cholesterol Cal: 26 mg/dL (ref 5–40)

## 2019-02-14 ENCOUNTER — Telehealth: Payer: Self-pay | Admitting: Family Medicine

## 2019-02-14 NOTE — Telephone Encounter (Signed)
FYI

## 2019-05-15 ENCOUNTER — Ambulatory Visit (INDEPENDENT_AMBULATORY_CARE_PROVIDER_SITE_OTHER): Payer: Medicare HMO | Admitting: Family Medicine

## 2019-05-15 ENCOUNTER — Encounter: Payer: Self-pay | Admitting: Family Medicine

## 2019-05-15 DIAGNOSIS — F5104 Psychophysiologic insomnia: Secondary | ICD-10-CM | POA: Diagnosis not present

## 2019-05-15 NOTE — Progress Notes (Signed)
Virtual Visit via telephone Note  I connected with Lauren Bruce on 05/15/19 at [phone] by telephone and verified that I am speaking with the correct person using two identifiers. Lauren Bruce is currently located at home and no other people are currently with her during visit. The provider, Fransisca Kaufmann Rue Valladares, MD is located in their office at time of visit.  Call ended at 0930  I discussed the limitations, risks, security and privacy concerns of performing an evaluation and management service by telephone and the availability of in person appointments. I also discussed with the patient that there may be a patient responsible charge related to this service. The patient expressed understanding and agreed to proceed.   History and Present Illness: Insomnia Patient is taking Remeron for insomnia and had one episode of lightheadedness and felt like she was going to pass out. She felt weak and thought it was from the medicine, she stopped it and is taking melatonin and it is not helping and she keeps waking up. She denies any suicidal ideations.  She naps during day. She feels like the melatonin is helping her relax but if not working in the future may consider trazodone.   No diagnosis found.  Outpatient Encounter Medications as of 05/15/2019  Medication Sig  . diphenhydrAMINE (BENADRYL) 25 MG tablet Take 25 mg by mouth every 6 (six) hours as needed. For allergies  . ibuprofen (ADVIL,MOTRIN) 200 MG tablet Take 400 mg by mouth every 6 (six) hours as needed. For pain  . mirtazapine (REMERON) 15 MG tablet Take 1 tablet (15 mg total) by mouth at bedtime.  . naproxen sodium (ALEVE) 220 MG tablet Take 220 mg by mouth 2 (two) times daily as needed. pain   No facility-administered encounter medications on file as of 05/15/2019.    Review of Systems  Constitutional: Negative for chills and fever.  Eyes: Negative for redness and visual disturbance.  Respiratory: Negative for chest tightness and  shortness of breath.   Cardiovascular: Negative for chest pain and leg swelling.  Musculoskeletal: Negative for back pain and gait problem.  Skin: Negative for rash.  Neurological: Negative for light-headedness and headaches.  Psychiatric/Behavioral: Positive for sleep disturbance. Negative for agitation, behavioral problems, decreased concentration, dysphoric mood, self-injury and suicidal ideas. The patient is nervous/anxious.   All other systems reviewed and are negative.   Observations/Objective: Patient sounds comfortable and in no acute distress  Assessment and Plan: Problem List Items Addressed This Visit      Other   Insomnia - Primary       Follow up plan: Return in about 9 months (around 02/12/2020), or if symptoms worsen or fail to improve, for yearly bloodwork.     I discussed the assessment and treatment plan with the patient. The patient was provided an opportunity to ask questions and all were answered. The patient agreed with the plan and demonstrated an understanding of the instructions.   The patient was advised to call back or seek an in-person evaluation if the symptoms worsen or if the condition fails to improve as anticipated.  The above assessment and management plan was discussed with the patient. The patient verbalized understanding of and has agreed to the management plan. Patient is aware to call the clinic if symptoms persist or worsen. Patient is aware when to return to the clinic for a follow-up visit. Patient educated on when it is appropriate to go to the emergency department.    I provided 12 minutes of  non-face-to-face time during this encounter.    Worthy Rancher, MD

## 2019-05-16 ENCOUNTER — Ambulatory Visit (INDEPENDENT_AMBULATORY_CARE_PROVIDER_SITE_OTHER): Payer: Medicare HMO | Admitting: *Deleted

## 2019-05-16 NOTE — Progress Notes (Signed)
Started medicare wellness visit with patient.  Patient states that she just had a televisit with the Dr and she was ok.  Patient declines finishing the visit.

## 2020-05-19 ENCOUNTER — Ambulatory Visit (INDEPENDENT_AMBULATORY_CARE_PROVIDER_SITE_OTHER): Payer: Medicare Other | Admitting: *Deleted

## 2020-05-19 VITALS — BP 131/75 | Ht 63.0 in | Wt 118.0 lb

## 2020-05-19 DIAGNOSIS — Z0000 Encounter for general adult medical examination without abnormal findings: Secondary | ICD-10-CM | POA: Diagnosis not present

## 2020-05-19 NOTE — Progress Notes (Signed)
MEDICARE ANNUAL WELLNESS VISIT  05/19/2020  Telephone Visit Disclaimer This Medicare AWV was conducted by telephone due to national recommendations for restrictions regarding the COVID-19 Pandemic (e.g. social distancing).  I verified, using two identifiers, that I am speaking with Lauren Bruce or their authorized healthcare agent. I discussed the limitations, risks, security, and privacy concerns of performing an evaluation and management service by telephone and the potential availability of an in-person appointment in the future. The patient expressed understanding and agreed to proceed.  Location of Patient: in her home Location of Provider (nurse):  In office  Subjective:    Lauren Bruce is a 87 y.o. female patient of Dettinger, Fransisca Kaufmann, MD who had a Medicare Annual Wellness Visit today via telephone. Lauren Bruce is Retired and lives with their family. she has 3 children living, 1 passed away.  she reports that she is socially active and does interact with friends/family regularly. she is minimally physically active and enjoys spending time with her family.  Patient Care Team: Dettinger, Fransisca Kaufmann, MD as PCP - General (Family Medicine)  Advanced Directives 05/19/2020 06/29/2015 06/29/2015 10/11/2011 10/04/2011  Does Patient Have a Medical Advance Directive? Yes No No Patient does not have advance directive Patient does not have advance directive;Patient would like information  Type of Advance Directive Living will - - - -  Does patient want to make changes to medical advance directive? No - Patient declined - - - -  Would patient like information on creating a medical advance directive? - No - patient declined information No - patient declined information - Advance directive packet given  Pre-existing out of facility DNR order (yellow form or pink MOST form) - - - No No    Hospital Utilization Over the Past 12 Months: # of hospitalizations or ER visits: 0 # of surgeries: 0  Review of  Systems    Patient reports that her overall health is unchanged compared to last year.  General ROS: negative  Patient Reported Readings (BP, Pulse, CBG, Weight, etc) BP 131/75   Ht 5\' 3"  (1.6 m)   Wt 118 lb (53.5 kg)   LMP 10/04/2011   BMI 20.90 kg/m    Pain Assessment       Current Medications & Allergies (verified) Allergies as of 05/19/2020      Reactions   Demerol [meperidine] Shortness Of Breath   Xylocaine [lidocaine] Shortness Of Breath   Occurred with dental procedure using Lido and Epinephrine. Tolerated Marcaine at [facility] without problems      Medication List       Accurate as of May 19, 2020  1:38 PM. If you have any questions, ask your nurse or doctor.        diphenhydrAMINE 25 MG tablet Commonly known as: BENADRYL Take 25 mg by mouth every 6 (six) hours as needed. For allergies   ibuprofen 200 MG tablet Commonly known as: ADVIL Take 400 mg by mouth every 6 (six) hours as needed. For pain   naproxen sodium 220 MG tablet Commonly known as: ALEVE Take 220 mg by mouth 2 (two) times daily as needed. pain       History (reviewed): Past Medical History:  Diagnosis Date  . Anginal pain ([place])   . Arthritis   . Cancer ([place])    skin cancer on her left eye   . Mitral valve prolapse   . PONV (postoperative nausea and vomiting)    Past Surgical History:  Procedure Laterality Date  .  ABDOMINAL HYSTERECTOMY    . ANTERIOR AND POSTERIOR REPAIR  10/11/2011   Procedure: ANTERIOR (CYSTOCELE) AND POSTERIOR REPAIR (RECTOCELE);  Surgeon: Jonnie Kind, MD;  Location: [facility];  Service: Gynecology;  Laterality: N/A;  . BREAST SURGERY     benign, right, [place], [place], [place]  . SALPINGOOPHORECTOMY  10/11/2011   Procedure: SALPINGO OOPHERECTOMY;  Surgeon: Jonnie Kind, MD;  Location: [facility];  Service: Gynecology;  Laterality: Right;  . TONSILLECTOMY    . VAGINAL HYSTERECTOMY  10/11/2011   Procedure: HYSTERECTOMY VAGINAL;   Surgeon: Jonnie Kind, MD;  Location: [facility];  Service: Gynecology;  Laterality: N/A;   Family History  Problem Relation Age of Onset  . Heart disease Sister   . Diabetes Brother   . Heart disease Brother   . Cancer Daughter        throat   . Pneumonia Daughter   . Heart attack Son   . GI problems Son    Social History   Socioeconomic History  . Marital status: Married    Spouse name: Mortimer Fries   . Number of children: 4  . Years of education: Not on file  . Highest education level: Not on file  Occupational History  . Not on file  Tobacco Use  . Smoking status: Former Research scientist (life sciences)  . Smokeless tobacco: Never Used  Vaping Use  . Vaping Use: Never used  Substance and Sexual Activity  . Alcohol use: No  . Drug use: No  . Sexual activity: Never    Birth control/protection: Surgical  Other Topics Concern  . Not on file  Social History Narrative   4 children, daughter passed away    Social Determinants of Health   Financial Resource Strain: Not on file  Food Insecurity: Not on file  Transportation Needs: Not on file  Physical Activity: Not on file  Stress: Not on file  Social Connections: Not on file    Activities of Daily Living In your present state of health, do you have any difficulty performing the following activities: 05/19/2020  Hearing? N  Vision? Y  Comment glasses  Difficulty concentrating or making decisions? N  Walking or climbing stairs? N  Dressing or bathing? N  Doing errands, shopping? N  Preparing Food and eating ? N  Using the Toilet? N  In the past six months, have you accidently leaked urine? N  Do you have problems with loss of bowel control? N  Managing your Medications? N  Managing your Finances? N  Housekeeping or managing your Housekeeping? N  Some recent data might be hidden    Patient Education/ Literacy    Exercise Current Exercise Habits: Home exercise routine, Type of exercise: walking;Other - see comments (yard work), Time  (Minutes): 30, Frequency (Times/Week): 4, Weekly Exercise (Minutes/Week): 120, Intensity: Mild, Exercise limited by: None identified  Diet Patient reports consuming 2 meals a day and 1 snack(s) a day Patient reports that her primary diet is: Regular Patient reports that she does have regular access to food.   Depression Screen PHQ 2/9 Scores 05/19/2020 02/12/2019  PHQ - 2 Score 1 0     Fall Risk Fall Risk  05/19/2020 02/12/2019  Falls in the past year? 1 0  Number falls in past yr: 0 -  Injury with Fall? 0 -  Risk for fall due to : History of fall(s) -  Follow up Falls evaluation completed -     Objective:  Lauren Bruce seemed alert and oriented and she participated appropriately during our telephone visit.  Blood Pressure Weight BMI  BP Readings from Last 3 Encounters:  05/19/20 131/75  02/12/19 131/75  03/09/16 130/74   Wt Readings from Last 3 Encounters:  05/19/20 118 lb (53.5 kg)  05/16/19 119 lb 4.3 oz (54.1 kg)  02/12/19 119 lb 3.2 oz (54.1 kg)   BMI Readings from Last 1 Encounters:  05/19/20 20.90 kg/m    *Unable to obtain current vital signs, weight, and BMI due to telephone visit type  Hearing/Vision  . Lauren Bruce did not seem to have difficulty with hearing/understanding during the telephone conversation . Reports that she has not had a formal eye exam by an eye care professional within the past year . Reports that she has not had a formal hearing evaluation within the past year *Unable to fully assess hearing and vision during telephone visit type  Cognitive Function: 6CIT Screen 05/19/2020  What Year? 0 points  What month? 0 points  What time? 0 points  Count back from 20 0 points  Months in reverse 0 points  Repeat phrase 0 points  Total Score 0   (Normal:0-7, Significant for Dysfunction: >8)  Normal Cognitive Function Screening: Yes   Immunization & Health Maintenance Record Immunization History  Administered Date(s) Administered  . Pneumococcal  Conjugate-13 09/13/2016  . Tdap 10/02/2014    Health Maintenance  Topic Date Due  . DEXA SCAN  Never done  . PNA vac Low Risk Adult (2 of 2 - PPSV23) 09/13/2017  . INFLUENZA VACCINE  07/02/2020 (Originally 11/03/2019)  . COVID-19 Vaccine (1) 07/02/2020 (Originally 10/20/1938)  . TETANUS/TDAP  10/01/2024       Assessment  This is a routine wellness examination for Lauren Bruce.  Health Maintenance: Due or Overdue Health Maintenance Due  Topic Date Due  . DEXA SCAN  Never done  . PNA vac Low Risk Adult (2 of 2 - PPSV23) 09/13/2017    Lauren Bruce does not need a referral for Community Assistance: Care Management:   no Social Work:    no Prescription Assistance:  no Nutrition/Diabetes Education:  no   Plan:  Personalized Goals Goals Addressed            This Visit's Progress   . Prevent falls       Stay active      Personalized Health Maintenance & Screening Recommendations  Pneumococcal vaccine  Bone densitometry screening  Lung Cancer Screening Recommended: no (Low Dose CT Chest recommended if Age 74-80 years, 30 pack-year currently smoking OR have quit w/in past 15 years) Hepatitis C Screening recommended: no HIV Screening recommended: no  Advanced Directives: Written information was not prepared per patient's request.  Referrals & Orders No orders of the defined types were placed in this encounter.   Follow-up Plan . Follow-up with Dettinger, Fransisca Kaufmann, MD as planned    I have personally reviewed and noted the following in the patient's chart:   . Medical and social history . Use of alcohol, tobacco or illicit drugs  . Current medications and supplements . Functional ability and status . Nutritional status . Physical activity . Advanced directives . List of other physicians . Hospitalizations, surgeries, and ER visits in previous 12 months . Vitals . Screenings to include cognitive, depression, and falls . Referrals and appointments  In  addition, I have reviewed and discussed with Lauren Bruce certain preventive protocols, quality metrics, and best practice recommendations. A  written personalized care plan for preventive services as well as general preventive health recommendations is available and can be mailed to the patient at her request.      [place] Dec  05/19/2020

## 2020-05-19 NOTE — Patient Instructions (Signed)
  Ms. Madia , Thank you for taking time to come for your Medicare Wellness Visit. I appreciate your ongoing commitment to your health goals. Please review the following plan we discussed and let me know if I can assist you in the future.   These are the goals we discussed: Goals    . Prevent falls     Stay active       This is a list of the screening recommended for you and due dates:  Health Maintenance  Topic Date Due  . DEXA scan (bone density measurement)  Never done  . Pneumonia vaccines (2 of 2 - PPSV23) 09/13/2017  . Flu Shot  07/02/2020*  . COVID-19 Vaccine (1) 07/02/2020*  . Tetanus Vaccine  10/01/2024  *Topic was postponed. The date shown is not the original due date.

## 2021-03-12 ENCOUNTER — Encounter: Payer: Medicare Other | Admitting: Family Medicine

## 2021-05-26 ENCOUNTER — Telehealth: Payer: Self-pay | Admitting: Family Medicine

## 2021-06-18 ENCOUNTER — Encounter: Payer: Self-pay | Admitting: Family Medicine

## 2021-06-18 ENCOUNTER — Ambulatory Visit: Payer: Medicare Other | Admitting: Family Medicine

## 2021-07-05 ENCOUNTER — Encounter: Payer: Self-pay | Admitting: Family Medicine

## 2021-07-05 ENCOUNTER — Ambulatory Visit (INDEPENDENT_AMBULATORY_CARE_PROVIDER_SITE_OTHER): Payer: Medicare Other | Admitting: Family Medicine

## 2021-07-05 VITALS — BP 143/74 | HR 83 | Temp 97.6°F | Ht 63.0 in | Wt 126.2 lb

## 2021-07-05 DIAGNOSIS — E785 Hyperlipidemia, unspecified: Secondary | ICD-10-CM

## 2021-07-05 DIAGNOSIS — R739 Hyperglycemia, unspecified: Secondary | ICD-10-CM

## 2021-07-05 NOTE — Progress Notes (Signed)
? ?BP (!) 143/74   Pulse 83   Temp 97.6 ?F (36.4 ?C) (Temporal)   Ht 5' 3"  (1.6 m)   Wt 126 lb 3.2 oz (57.2 kg)   LMP 10/04/2011   BMI 22.36 kg/m?   ? ?Subjective:  ? ?Patient ID: Lauren Bruce, female    DOB: 08-Jan-1934, 88 y.o.   MRN: [id number] ? ?HPI: ?Lauren Bruce is a 88 y.o. female presenting on 07/05/2021 for Medical Management of Chronic Issues and Hip Pain (Left hip pain with walking x 4 months ) ? ? ?HPI ?Hyperlipidemia ?Patient is coming in for recheck of his hyperlipidemia. The patient is currently taking no medicine currently. They deny any issues with myalgias or history of liver damage from it. They deny any focal numbness or weakness or chest pain.  ? ?Elevated blood sugar ?Patient is coming in for recheck for elevated blood sugar.  Last time which was supposed to come in her sugar was elevated and we reviewed and check an A1c to see how she is doing.  This is passed quite a bit of time, we will check an A1c today. ? ?There was some comment about memory or dementia possibly testing today.  Patient refused testing and said she did not need it. ? ?Relevant past medical, surgical, family and social history reviewed and updated as indicated. Interim medical history since our last visit reviewed. ?Allergies and medications reviewed and updated. ? ?Review of Systems  ?Constitutional:  Negative for chills and fever.  ?Eyes:  Negative for visual disturbance.  ?Respiratory:  Negative for chest tightness and shortness of breath.   ?Cardiovascular:  Negative for chest pain and leg swelling.  ?Musculoskeletal:  Positive for arthralgias (Patient has chronic arthritis in her right back and hip). Negative for back pain and gait problem.  ?Skin:  Negative for rash.  ?Neurological:  Negative for light-headedness and headaches.  ?Psychiatric/Behavioral:  Negative for agitation and behavioral problems.   ?All other systems reviewed and are negative. ? ?Per HPI unless specifically indicated above ? ? ?Allergies  as of 07/05/2021   ? ?   Reactions  ? Demerol [meperidine] Shortness Of Breath  ? Xylocaine [lidocaine] Shortness Of Breath  ? Occurred with dental procedure using Lido and Epinephrine. Tolerated Marcaine at [facility] without problems  ? ?  ? ?  ?Medication List  ?  ? ?  ? Accurate as of July 05, 2021  4:19 PM. If you have any questions, ask your nurse or doctor.  ?  ?  ? ?  ? ?diphenhydrAMINE 25 MG tablet ?Commonly known as: BENADRYL ?Take 25 mg by mouth every 6 (six) hours as needed. For allergies ?  ?ibuprofen 200 MG tablet ?Commonly known as: ADVIL ?Take 400 mg by mouth every 6 (six) hours as needed. For pain ?  ?naproxen sodium 220 MG tablet ?Commonly known as: ALEVE ?Take 220 mg by mouth 2 (two) times daily as needed. pain ?  ? ?  ? ? ? ?Objective:  ? ?BP (!) 143/74   Pulse 83   Temp 97.6 ?F (36.4 ?C) (Temporal)   Ht 5' 3"  (1.6 m)   Wt 126 lb 3.2 oz (57.2 kg)   LMP 10/04/2011   BMI 22.36 kg/m?   ?Wt Readings from Last 3 Encounters:  ?07/05/21 126 lb 3.2 oz (57.2 kg)  ?05/19/20 118 lb (53.5 kg)  ?05/16/19 119 lb 4.3 oz (54.1 kg)  ?  ?Physical Exam ?Vitals and nursing note reviewed.  ?Constitutional:   ?  General: She is not in acute distress. ?   Appearance: She is well-developed. She is not diaphoretic.  ?Eyes:  ?   Conjunctiva/sclera: Conjunctivae normal.  ?Cardiovascular:  ?   Rate and Rhythm: Normal rate and regular rhythm.  ?   Heart sounds: Normal heart sounds. No murmur heard. ?Pulmonary:  ?   Effort: Pulmonary effort is normal. No respiratory distress.  ?   Breath sounds: Normal breath sounds. No wheezing.  ?Musculoskeletal:     ?   General: No swelling or tenderness. Normal range of motion.  ?Skin: ?   General: Skin is warm and dry.  ?   Findings: No rash.  ?Neurological:  ?   Mental Status: She is alert and oriented to person, place, and time.  ?   Coordination: Coordination normal.  ?Psychiatric:     ?   Behavior: Behavior normal.  ? ? ? ? ?Assessment & Plan:  ? ?Problem List Items Addressed This  Visit   ? ?  ? Other  ? Dyslipidemia - Primary  ? Relevant Orders  ? Bayer DCA Hb A1c Waived  ? CBC with Differential/Platelet  ? CMP14+EGFR  ? Lipid panel  ? ?Other Visit Diagnoses   ? ? Elevated blood sugar      ? Relevant Orders  ? Bayer DCA Hb A1c Waived  ? ?  ?  ?We will check blood work.  She is not currently on any medicine.  We will see what the blood work shows and then contact her. ?Follow up plan: ?Return in about 1 year (around 07/06/2022), or if symptoms worsen or fail to improve, for Cholesterol and blood sugar. ? ?Counseling provided for all of the vaccine components ?Orders Placed This Encounter  ?Procedures  ? Bayer DCA Hb A1c Waived  ? CBC with Differential/Platelet  ? CMP14+EGFR  ? Lipid panel  ? ? ?Caryl Pina, MD ?[place] ?07/05/2021, 4:19 PM ? ? ? ? ?

## 2021-07-06 LAB — BAYER DCA HB A1C WAIVED: HB A1C (BAYER DCA - WAIVED): 5.2 % (ref 4.8–5.6)

## 2021-07-06 LAB — CMP14+EGFR
ALT: 9 IU/L (ref 0–32)
AST: 15 IU/L (ref 0–40)
Albumin/Globulin Ratio: 1.9 (ref 1.2–2.2)
Albumin: 4.3 g/dL (ref 3.6–4.6)
Alkaline Phosphatase: 67 IU/L (ref 44–121)
BUN/Creatinine Ratio: 14 (ref 12–28)
BUN: 13 mg/dL (ref 8–27)
Bilirubin Total: 0.3 mg/dL (ref 0.0–1.2)
CO2: 26 mmol/L (ref 20–29)
Calcium: 10.1 mg/dL (ref 8.7–10.3)
Chloride: 101 mmol/L (ref 96–106)
Creatinine, Ser: 0.9 mg/dL (ref 0.57–1.00)
Globulin, Total: 2.3 g/dL (ref 1.5–4.5)
Glucose: 90 mg/dL (ref 70–99)
Potassium: 5.1 mmol/L (ref 3.5–5.2)
Sodium: 139 mmol/L (ref 134–144)
Total Protein: 6.6 g/dL (ref 6.0–8.5)
eGFR: 63 mL/min/{1.73_m2} (ref >59)

## 2021-07-06 LAB — CBC WITH DIFFERENTIAL/PLATELET
Basophils Absolute: 0 10*3/uL (ref 0.0–0.2)
Basos: 1 %
EOS (ABSOLUTE): 0.1 10*3/uL (ref 0.0–0.4)
Eos: 1 %
Hematocrit: 39.5 % (ref 34.0–46.6)
Hemoglobin: 13.1 g/dL (ref 11.1–15.9)
Immature Grans (Abs): 0 10*3/uL (ref 0.0–0.1)
Immature Granulocytes: 0 %
Lymphocytes Absolute: 3 10*3/uL (ref 0.7–3.1)
Lymphs: 37 %
MCH: 29.8 pg (ref 26.6–33.0)
MCHC: 33.2 g/dL (ref 31.5–35.7)
MCV: 90 fL (ref 79–97)
Monocytes Absolute: 0.8 10*3/uL (ref 0.1–0.9)
Monocytes: 10 %
Neutrophils Absolute: 4.2 10*3/uL (ref 1.4–7.0)
Neutrophils: 51 %
Platelets: 434 10*3/uL (ref 150–450)
RBC: 4.39 x10E6/uL (ref 3.77–5.28)
RDW: 11.7 % (ref 11.7–15.4)
WBC: 8.2 10*3/uL (ref 3.4–10.8)

## 2021-07-06 LAB — LIPID PANEL
Chol/HDL Ratio: 3.6 ratio (ref 0.0–4.4)
Cholesterol, Total: 224 mg/dL — ABNORMAL HIGH (ref 100–199)
HDL: 63 mg/dL (ref >39)
LDL Chol Calc (NIH): 143 mg/dL — ABNORMAL HIGH (ref 0–99)
Triglycerides: 104 mg/dL (ref 0–149)
VLDL Cholesterol Cal: 18 mg/dL (ref 5–40)

## 2021-08-11 ENCOUNTER — Emergency Department (HOSPITAL_COMMUNITY): Payer: Medicare Other

## 2021-08-11 ENCOUNTER — Emergency Department (HOSPITAL_COMMUNITY)
Admission: EM | Admit: 2021-08-11 | Discharge: 2021-08-11 | Disposition: A | Discharge status: Home / Self Care | Payer: Medicare Other | Attending: Emergency Medicine | Admitting: Emergency Medicine

## 2021-08-11 ENCOUNTER — Other Ambulatory Visit: Payer: Self-pay

## 2021-08-11 ENCOUNTER — Encounter (HOSPITAL_COMMUNITY): Payer: Self-pay | Admitting: Emergency Medicine

## 2021-08-11 DIAGNOSIS — Y92410 Unspecified street and highway as the place of occurrence of the external cause: Secondary | ICD-10-CM | POA: Diagnosis not present

## 2021-08-11 DIAGNOSIS — S3991XA Unspecified injury of abdomen, initial encounter: Secondary | ICD-10-CM | POA: Diagnosis not present

## 2021-08-11 DIAGNOSIS — S40212A Abrasion of left shoulder, initial encounter: Secondary | ICD-10-CM | POA: Diagnosis not present

## 2021-08-11 DIAGNOSIS — R1084 Generalized abdominal pain: Secondary | ICD-10-CM | POA: Diagnosis not present

## 2021-08-11 DIAGNOSIS — M25511 Pain in right shoulder: Secondary | ICD-10-CM | POA: Diagnosis not present

## 2021-08-11 DIAGNOSIS — R079 Chest pain, unspecified: Secondary | ICD-10-CM | POA: Diagnosis not present

## 2021-08-11 DIAGNOSIS — M25552 Pain in left hip: Secondary | ICD-10-CM | POA: Diagnosis not present

## 2021-08-11 DIAGNOSIS — S299XXA Unspecified injury of thorax, initial encounter: Secondary | ICD-10-CM | POA: Diagnosis not present

## 2021-08-11 DIAGNOSIS — R519 Headache, unspecified: Secondary | ICD-10-CM | POA: Insufficient documentation

## 2021-08-11 DIAGNOSIS — S12040A Displaced lateral mass fracture of first cervical vertebra, initial encounter for closed fracture: Secondary | ICD-10-CM | POA: Diagnosis not present

## 2021-08-11 DIAGNOSIS — R9431 Abnormal electrocardiogram [ECG] [EKG]: Secondary | ICD-10-CM | POA: Diagnosis not present

## 2021-08-11 DIAGNOSIS — M25519 Pain in unspecified shoulder: Secondary | ICD-10-CM | POA: Diagnosis not present

## 2021-08-11 DIAGNOSIS — S12100A Unspecified displaced fracture of second cervical vertebra, initial encounter for closed fracture: Secondary | ICD-10-CM | POA: Insufficient documentation

## 2021-08-11 DIAGNOSIS — M1612 Unilateral primary osteoarthritis, left hip: Secondary | ICD-10-CM | POA: Diagnosis not present

## 2021-08-11 DIAGNOSIS — R0789 Other chest pain: Secondary | ICD-10-CM | POA: Insufficient documentation

## 2021-08-11 DIAGNOSIS — S199XXA Unspecified injury of neck, initial encounter: Secondary | ICD-10-CM | POA: Diagnosis present

## 2021-08-11 DIAGNOSIS — R102 Pelvic and perineal pain: Secondary | ICD-10-CM | POA: Diagnosis not present

## 2021-08-11 DIAGNOSIS — S0990XA Unspecified injury of head, initial encounter: Secondary | ICD-10-CM | POA: Diagnosis not present

## 2021-08-11 LAB — BASIC METABOLIC PANEL
Anion gap: 9 (ref 5–15)
BUN: 11 mg/dL (ref 8–23)
CO2: 26 mmol/L (ref 22–32)
Calcium: 9.2 mg/dL (ref 8.9–10.3)
Chloride: 99 mmol/L (ref 98–111)
Creatinine, Ser: 0.89 mg/dL (ref 0.44–1.00)
GFR, Estimated: >60 mL/min (ref >60)
Glucose, Bld: 109 mg/dL — ABNORMAL HIGH (ref 70–99)
Potassium: 4 mmol/L (ref 3.5–5.1)
Sodium: 134 mmol/L — ABNORMAL LOW (ref 135–145)

## 2021-08-11 LAB — CBC WITH DIFFERENTIAL/PLATELET
Abs Immature Granulocytes: 0.07 10*3/uL (ref 0.00–0.07)
Basophils Absolute: 0 10*3/uL (ref 0.0–0.1)
Basophils Relative: 0 %
Eosinophils Absolute: 0.1 10*3/uL (ref 0.0–0.5)
Eosinophils Relative: 1 %
HCT: 38.9 % (ref 36.0–46.0)
Hemoglobin: 12.8 g/dL (ref 12.0–15.0)
Immature Granulocytes: 1 %
Lymphocytes Relative: 11 %
Lymphs Abs: 1.2 10*3/uL (ref 0.7–4.0)
MCH: 30 pg (ref 26.0–34.0)
MCHC: 32.9 g/dL (ref 30.0–36.0)
MCV: 91.3 fL (ref 80.0–100.0)
Monocytes Absolute: 0.7 10*3/uL (ref 0.1–1.0)
Monocytes Relative: 7 %
Neutro Abs: 8.6 10*3/uL — ABNORMAL HIGH (ref 1.7–7.7)
Neutrophils Relative %: 80 %
Platelets: 323 10*3/uL (ref 150–400)
RBC: 4.26 MIL/uL (ref 3.87–5.11)
RDW: 12.2 % (ref 11.5–15.5)
WBC: 10.8 10*3/uL — ABNORMAL HIGH (ref 4.0–10.5)
nRBC: 0 % (ref 0.0–0.2)

## 2021-08-11 MED ORDER — IOHEXOL 300 MG/ML  SOLN
100.0000 mL | Freq: Once | INTRAMUSCULAR | Status: AC | PRN
  Administered 2021-08-11: 100 mL via INTRAVENOUS
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
## 2021-08-11 NOTE — Discharge Instructions (Addendum)
Wear the neck brace at all times until you are seen by neurosurgery. ?You can take Tylenol and ibuprofen for pain. ?Call Dr. Windy Carina office tomorrow to schedule an appointment for later next week. ?Follow-up with your primary care doctor later this week for reevaluation. ?Return to ED if you have new or worsening symptoms. ?

## 2021-08-16 ENCOUNTER — Emergency Department (HOSPITAL_COMMUNITY)
Admission: EM | Admit: 2021-08-16 | Discharge: 2021-08-17 | Disposition: A | Discharge status: Home / Self Care | Payer: Medicare Other | Attending: Emergency Medicine | Admitting: Emergency Medicine

## 2021-08-16 ENCOUNTER — Encounter (HOSPITAL_COMMUNITY): Payer: Self-pay | Admitting: Emergency Medicine

## 2021-08-16 ENCOUNTER — Encounter: Payer: Self-pay | Admitting: Family Medicine

## 2021-08-16 ENCOUNTER — Ambulatory Visit (INDEPENDENT_AMBULATORY_CARE_PROVIDER_SITE_OTHER): Payer: Medicare Other | Admitting: Family Medicine

## 2021-08-16 ENCOUNTER — Other Ambulatory Visit: Payer: Self-pay

## 2021-08-16 VITALS — BP 145/75 | HR 99 | Temp 97.5°F | Ht 63.0 in | Wt 126.0 lb

## 2021-08-16 DIAGNOSIS — S12100A Unspecified displaced fracture of second cervical vertebra, initial encounter for closed fracture: Secondary | ICD-10-CM

## 2021-08-16 DIAGNOSIS — R531 Weakness: Secondary | ICD-10-CM | POA: Insufficient documentation

## 2021-08-16 DIAGNOSIS — R0689 Other abnormalities of breathing: Secondary | ICD-10-CM | POA: Diagnosis not present

## 2021-08-16 DIAGNOSIS — I10 Essential (primary) hypertension: Secondary | ICD-10-CM | POA: Diagnosis not present

## 2021-08-16 LAB — BASIC METABOLIC PANEL
Anion gap: 7 (ref 5–15)
BUN: 20 mg/dL (ref 8–23)
CO2: 29 mmol/L (ref 22–32)
Calcium: 9.7 mg/dL (ref 8.9–10.3)
Chloride: 96 mmol/L — ABNORMAL LOW (ref 98–111)
Creatinine, Ser: 0.71 mg/dL (ref 0.44–1.00)
GFR, Estimated: >60 mL/min (ref >60)
Glucose, Bld: 141 mg/dL — ABNORMAL HIGH (ref 70–99)
Potassium: 4.2 mmol/L (ref 3.5–5.1)
Sodium: 132 mmol/L — ABNORMAL LOW (ref 135–145)

## 2021-08-16 LAB — CBC WITH DIFFERENTIAL/PLATELET
Abs Immature Granulocytes: 0.03 10*3/uL (ref 0.00–0.07)
Basophils Absolute: 0 10*3/uL (ref 0.0–0.1)
Basophils Relative: 0 %
Eosinophils Absolute: 0 10*3/uL (ref 0.0–0.5)
Eosinophils Relative: 0 %
HCT: 39.1 % (ref 36.0–46.0)
Hemoglobin: 13.2 g/dL (ref 12.0–15.0)
Immature Granulocytes: 0 %
Lymphocytes Relative: 8 %
Lymphs Abs: 0.7 10*3/uL (ref 0.7–4.0)
MCH: 30.3 pg (ref 26.0–34.0)
MCHC: 33.8 g/dL (ref 30.0–36.0)
MCV: 89.9 fL (ref 80.0–100.0)
Monocytes Absolute: 0.4 10*3/uL (ref 0.1–1.0)
Monocytes Relative: 5 %
Neutro Abs: 7.3 10*3/uL (ref 1.7–7.7)
Neutrophils Relative %: 87 %
Platelets: 354 10*3/uL (ref 150–400)
RBC: 4.35 MIL/uL (ref 3.87–5.11)
RDW: 12.1 % (ref 11.5–15.5)
WBC: 8.4 10*3/uL (ref 4.0–10.5)
nRBC: 0 % (ref 0.0–0.2)

## 2021-08-16 MED ORDER — TRAMADOL HCL 50 MG PO TABS: 50.0000 mg | ORAL_TABLET | Freq: Four times a day (QID) | ORAL | 0 refills | Status: AC

## 2021-08-16 MED ORDER — MIRTAZAPINE 7.5 MG PO TABS: 7.5000 mg | ORAL_TABLET | Freq: Every day | ORAL | 1 refills | Status: DC
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
## 2021-08-17 MED ORDER — ONDANSETRON 4 MG PO TBDP
4.0000 mg | ORAL_TABLET | Freq: Once | ORAL | Status: AC
Start: 2021-08-17 — End: 2021-08-17
  Administered 2021-08-17: 4 mg via ORAL
  Filled 2021-08-17: qty 1

## 2021-08-17 NOTE — ED Notes (Signed)
Attempted to call pt son to pick pt up since she is being discharged. No answer. Will try again at different time. ?

## 2021-08-17 NOTE — ED Notes (Signed)
LVM @ home requesting a return call ?

## 2021-08-17 NOTE — ED Notes (Signed)
Pt vomited once in hallway. EDP made aware and awaiting orders. ?

## 2021-08-17 NOTE — Discharge Instructions (Signed)
You have been seen and discharged from the emergency department.  Your blood work was normal.  Your EKG was normal.  Follow-up with your primary provider for further evaluation and further care. Take home medications as prescribed. If you have any worsening symptoms or further concerns for your health please return to an emergency department for further evaluation. ?

## 2021-08-19 DIAGNOSIS — S12191A Other nondisplaced fracture of second cervical vertebra, initial encounter for closed fracture: Secondary | ICD-10-CM | POA: Diagnosis not present

## 2021-09-08 ENCOUNTER — Other Ambulatory Visit: Payer: Self-pay

## 2021-09-08 ENCOUNTER — Emergency Department (HOSPITAL_COMMUNITY): Payer: Medicare Other

## 2021-09-08 ENCOUNTER — Encounter (HOSPITAL_COMMUNITY): Payer: Self-pay

## 2021-09-08 ENCOUNTER — Inpatient Hospital Stay (HOSPITAL_COMMUNITY)
Admission: EM | Admit: 2021-09-08 | Discharge: 2021-09-14 | DRG: 480 | Disposition: A | Discharge status: Skilled Nursing Facility | Payer: Medicare Other | Attending: Family Medicine | Admitting: Family Medicine

## 2021-09-08 DIAGNOSIS — K219 Gastro-esophageal reflux disease without esophagitis: Secondary | ICD-10-CM | POA: Diagnosis not present

## 2021-09-08 DIAGNOSIS — R54 Age-related physical debility: Secondary | ICD-10-CM | POA: Diagnosis present

## 2021-09-08 DIAGNOSIS — R41841 Cognitive communication deficit: Secondary | ICD-10-CM | POA: Diagnosis not present

## 2021-09-08 DIAGNOSIS — Z66 Do not resuscitate: Secondary | ICD-10-CM | POA: Diagnosis present

## 2021-09-08 DIAGNOSIS — W19XXXA Unspecified fall, initial encounter: Secondary | ICD-10-CM | POA: Diagnosis not present

## 2021-09-08 DIAGNOSIS — Z85828 Personal history of other malignant neoplasm of skin: Secondary | ICD-10-CM

## 2021-09-08 DIAGNOSIS — Z79890 Hormone replacement therapy (postmenopausal): Secondary | ICD-10-CM | POA: Diagnosis not present

## 2021-09-08 DIAGNOSIS — S12101A Unspecified nondisplaced fracture of second cervical vertebra, initial encounter for closed fracture: Secondary | ICD-10-CM | POA: Diagnosis not present

## 2021-09-08 DIAGNOSIS — S72044A Nondisplaced fracture of base of neck of right femur, initial encounter for closed fracture: Secondary | ICD-10-CM | POA: Diagnosis not present

## 2021-09-08 DIAGNOSIS — R9431 Abnormal electrocardiogram [ECG] [EKG]: Secondary | ICD-10-CM | POA: Diagnosis not present

## 2021-09-08 DIAGNOSIS — Y9289 Other specified places as the place of occurrence of the external cause: Secondary | ICD-10-CM | POA: Diagnosis not present

## 2021-09-08 DIAGNOSIS — R102 Pelvic and perineal pain: Secondary | ICD-10-CM | POA: Diagnosis not present

## 2021-09-08 DIAGNOSIS — Z885 Allergy status to narcotic agent status: Secondary | ICD-10-CM

## 2021-09-08 DIAGNOSIS — Z8249 Family history of ischemic heart disease and other diseases of the circulatory system: Secondary | ICD-10-CM

## 2021-09-08 DIAGNOSIS — J9601 Acute respiratory failure with hypoxia: Secondary | ICD-10-CM | POA: Diagnosis present

## 2021-09-08 DIAGNOSIS — Z884 Allergy status to anesthetic agent status: Secondary | ICD-10-CM | POA: Diagnosis not present

## 2021-09-08 DIAGNOSIS — S7291XA Unspecified fracture of right femur, initial encounter for closed fracture: Secondary | ICD-10-CM | POA: Diagnosis not present

## 2021-09-08 DIAGNOSIS — R531 Weakness: Secondary | ICD-10-CM | POA: Diagnosis not present

## 2021-09-08 DIAGNOSIS — I341 Nonrheumatic mitral (valve) prolapse: Secondary | ICD-10-CM | POA: Diagnosis not present

## 2021-09-08 DIAGNOSIS — Z9889 Other specified postprocedural states: Secondary | ICD-10-CM | POA: Diagnosis not present

## 2021-09-08 DIAGNOSIS — Z87891 Personal history of nicotine dependence: Secondary | ICD-10-CM | POA: Diagnosis not present

## 2021-09-08 DIAGNOSIS — M1990 Unspecified osteoarthritis, unspecified site: Secondary | ICD-10-CM | POA: Diagnosis present

## 2021-09-08 DIAGNOSIS — R278 Other lack of coordination: Secondary | ICD-10-CM | POA: Diagnosis not present

## 2021-09-08 DIAGNOSIS — S72009A Fracture of unspecified part of neck of unspecified femur, initial encounter for closed fracture: Secondary | ICD-10-CM | POA: Diagnosis present

## 2021-09-08 DIAGNOSIS — S72001D Fracture of unspecified part of neck of right femur, subsequent encounter for closed fracture with routine healing: Secondary | ICD-10-CM | POA: Diagnosis not present

## 2021-09-08 DIAGNOSIS — Z90710 Acquired absence of both cervix and uterus: Secondary | ICD-10-CM

## 2021-09-08 DIAGNOSIS — T148XXA Other injury of unspecified body region, initial encounter: Secondary | ICD-10-CM | POA: Diagnosis not present

## 2021-09-08 DIAGNOSIS — R410 Disorientation, unspecified: Secondary | ICD-10-CM | POA: Diagnosis not present

## 2021-09-08 DIAGNOSIS — Z79899 Other long term (current) drug therapy: Secondary | ICD-10-CM | POA: Diagnosis not present

## 2021-09-08 DIAGNOSIS — R52 Pain, unspecified: Secondary | ICD-10-CM | POA: Diagnosis not present

## 2021-09-08 DIAGNOSIS — W1830XD Fall on same level, unspecified, subsequent encounter: Secondary | ICD-10-CM | POA: Diagnosis not present

## 2021-09-08 DIAGNOSIS — F17200 Nicotine dependence, unspecified, uncomplicated: Secondary | ICD-10-CM | POA: Diagnosis not present

## 2021-09-08 DIAGNOSIS — S72001A Fracture of unspecified part of neck of right femur, initial encounter for closed fracture: Principal | ICD-10-CM | POA: Diagnosis present

## 2021-09-08 DIAGNOSIS — F05 Delirium due to known physiological condition: Secondary | ICD-10-CM | POA: Diagnosis not present

## 2021-09-08 DIAGNOSIS — R269 Unspecified abnormalities of gait and mobility: Secondary | ICD-10-CM | POA: Diagnosis not present

## 2021-09-08 DIAGNOSIS — W010XXA Fall on same level from slipping, tripping and stumbling without subsequent striking against object, initial encounter: Secondary | ICD-10-CM | POA: Diagnosis present

## 2021-09-08 DIAGNOSIS — F5104 Psychophysiologic insomnia: Secondary | ICD-10-CM

## 2021-09-08 DIAGNOSIS — Z043 Encounter for examination and observation following other accident: Secondary | ICD-10-CM | POA: Diagnosis not present

## 2021-09-08 DIAGNOSIS — E785 Hyperlipidemia, unspecified: Secondary | ICD-10-CM | POA: Diagnosis not present

## 2021-09-08 DIAGNOSIS — S12100A Unspecified displaced fracture of second cervical vertebra, initial encounter for closed fracture: Secondary | ICD-10-CM | POA: Diagnosis present

## 2021-09-08 DIAGNOSIS — S12101D Unspecified nondisplaced fracture of second cervical vertebra, subsequent encounter for fracture with routine healing: Secondary | ICD-10-CM

## 2021-09-08 DIAGNOSIS — Z9181 History of falling: Secondary | ICD-10-CM | POA: Diagnosis not present

## 2021-09-08 DIAGNOSIS — M25551 Pain in right hip: Secondary | ICD-10-CM | POA: Diagnosis not present

## 2021-09-08 DIAGNOSIS — M159 Polyosteoarthritis, unspecified: Secondary | ICD-10-CM | POA: Diagnosis not present

## 2021-09-08 DIAGNOSIS — M6281 Muscle weakness (generalized): Secondary | ICD-10-CM | POA: Diagnosis not present

## 2021-09-08 DIAGNOSIS — R293 Abnormal posture: Secondary | ICD-10-CM | POA: Diagnosis not present

## 2021-09-08 DIAGNOSIS — G4700 Insomnia, unspecified: Secondary | ICD-10-CM | POA: Diagnosis not present

## 2021-09-08 DIAGNOSIS — E041 Nontoxic single thyroid nodule: Secondary | ICD-10-CM | POA: Diagnosis not present

## 2021-09-08 LAB — CBC WITH DIFFERENTIAL/PLATELET
Abs Immature Granulocytes: 0.07 10*3/uL (ref 0.00–0.07)
Basophils Absolute: 0 10*3/uL (ref 0.0–0.1)
Basophils Relative: 0 %
Eosinophils Absolute: 0.1 10*3/uL (ref 0.0–0.5)
Eosinophils Relative: 1 %
HCT: 36.9 % (ref 36.0–46.0)
Hemoglobin: 12.1 g/dL (ref 12.0–15.0)
Immature Granulocytes: 1 %
Lymphocytes Relative: 17 %
Lymphs Abs: 1.9 10*3/uL (ref 0.7–4.0)
MCH: 30.1 pg (ref 26.0–34.0)
MCHC: 32.8 g/dL (ref 30.0–36.0)
MCV: 91.8 fL (ref 80.0–100.0)
Monocytes Absolute: 0.8 10*3/uL (ref 0.1–1.0)
Monocytes Relative: 7 %
Neutro Abs: 8.6 10*3/uL — ABNORMAL HIGH (ref 1.7–7.7)
Neutrophils Relative %: 74 %
Platelets: 407 10*3/uL — ABNORMAL HIGH (ref 150–400)
RBC: 4.02 MIL/uL (ref 3.87–5.11)
RDW: 12.3 % (ref 11.5–15.5)
WBC: 11.5 10*3/uL — ABNORMAL HIGH (ref 4.0–10.5)
nRBC: 0 % (ref 0.0–0.2)

## 2021-09-08 LAB — URINALYSIS, ROUTINE W REFLEX MICROSCOPIC
Bilirubin Urine: NEGATIVE
Glucose, UA: NEGATIVE mg/dL
Hgb urine dipstick: NEGATIVE
Ketones, ur: NEGATIVE mg/dL
Leukocytes,Ua: NEGATIVE
Nitrite: NEGATIVE
Protein, ur: NEGATIVE mg/dL
Specific Gravity, Urine: 1.01 (ref 1.005–1.030)
pH: 7 (ref 5.0–8.0)

## 2021-09-08 LAB — BASIC METABOLIC PANEL
Anion gap: 7 (ref 5–15)
BUN: 11 mg/dL (ref 8–23)
CO2: 29 mmol/L (ref 22–32)
Calcium: 9.4 mg/dL (ref 8.9–10.3)
Chloride: 99 mmol/L (ref 98–111)
Creatinine, Ser: 0.88 mg/dL (ref 0.44–1.00)
GFR, Estimated: >60 mL/min (ref >60)
Glucose, Bld: 117 mg/dL — ABNORMAL HIGH (ref 70–99)
Potassium: 3.8 mmol/L (ref 3.5–5.1)
Sodium: 135 mmol/L (ref 135–145)

## 2021-09-08 LAB — TYPE AND SCREEN
ABO/RH(D): A POS
Antibody Screen: NEGATIVE

## 2021-09-08 LAB — PROTIME-INR
INR: 0.9 (ref 0.8–1.2)
Prothrombin Time: 12.1 seconds (ref 11.4–15.2)

## 2021-09-08 MED ORDER — OXYCODONE HCL 5 MG PO TABS
5.0000 mg | ORAL_TABLET | ORAL | Status: DC | PRN
  Administered 2021-09-09 (×2): 5 mg via ORAL
  Filled 2021-09-08 (×2): qty 1

## 2021-09-08 MED ORDER — ONDANSETRON HCL 4 MG/2ML IJ SOLN
4.0000 mg | Freq: Once | INTRAMUSCULAR | Status: AC
  Administered 2021-09-08: 4 mg via INTRAVENOUS
  Filled 2021-09-08: qty 2

## 2021-09-08 MED ORDER — PANTOPRAZOLE SODIUM 40 MG PO TBEC
40.0000 mg | DELAYED_RELEASE_TABLET | Freq: Every day | ORAL | Status: DC
  Administered 2021-09-11 – 2021-09-14 (×4): 40 mg via ORAL
  Filled 2021-09-08 (×3): qty 1

## 2021-09-08 MED ORDER — ACETAMINOPHEN 650 MG RE SUPP: 650.0000 mg | Freq: Four times a day (QID) | RECTAL | Status: DC | PRN

## 2021-09-08 MED ORDER — ACETAMINOPHEN 325 MG PO TABS: 650.0000 mg | ORAL_TABLET | Freq: Four times a day (QID) | ORAL | Status: DC | PRN

## 2021-09-08 MED ORDER — FENTANYL CITRATE PF 50 MCG/ML IJ SOSY
50.0000 ug | PREFILLED_SYRINGE | INTRAMUSCULAR | Status: AC | PRN
  Administered 2021-09-08 (×2): 50 ug via INTRAVENOUS
  Filled 2021-09-08 (×2): qty 1

## 2021-09-08 MED ORDER — MELATONIN 3 MG PO TABS
18.0000 mg | ORAL_TABLET | Freq: Every day | ORAL | Status: DC
  Administered 2021-09-08 – 2021-09-13 (×6): 18 mg via ORAL
  Filled 2021-09-08 (×6): qty 6

## 2021-09-08 MED ORDER — HEPARIN SODIUM (PORCINE) 5000 UNIT/ML IJ SOLN
5000.0000 [IU] | Freq: Three times a day (TID) | INTRAMUSCULAR | Status: DC
  Administered 2021-09-08 – 2021-09-09 (×2): 5000 [IU] via SUBCUTANEOUS
  Filled 2021-09-08 (×2): qty 1

## 2021-09-08 MED ORDER — ONDANSETRON HCL 4 MG/2ML IJ SOLN
4.0000 mg | Freq: Four times a day (QID) | INTRAMUSCULAR | Status: DC | PRN
  Administered 2021-09-09: 4 mg via INTRAVENOUS
  Filled 2021-09-08: qty 2

## 2021-09-08 MED ORDER — HYDROMORPHONE HCL 1 MG/ML IJ SOLN
1.0000 mg | Freq: Once | INTRAMUSCULAR | Status: AC
  Administered 2021-09-08: 1 mg via INTRAVENOUS

## 2021-09-08 MED ORDER — SODIUM CHLORIDE 0.9 % IV SOLN: INTRAVENOUS | Status: DC

## 2021-09-08 MED ORDER — MORPHINE SULFATE (PF) 2 MG/ML IV SOLN: 2.0000 mg | INTRAVENOUS | Status: DC | PRN

## 2021-09-08 MED ORDER — ONDANSETRON HCL 4 MG PO TABS: 4.0000 mg | ORAL_TABLET | Freq: Four times a day (QID) | ORAL | Status: DC | PRN

## 2021-09-08 MED ORDER — HYDROMORPHONE HCL 1 MG/ML IJ SOLN
INTRAMUSCULAR | Status: AC
  Filled 2021-09-08: qty 1

## 2021-09-08 MED ORDER — DIPHENHYDRAMINE HCL 50 MG/ML IJ SOLN: 25.0000 mg | Freq: Every evening | INTRAMUSCULAR | Status: DC | PRN

## 2021-09-08 NOTE — Assessment & Plan Note (Signed)
-   Continue home dose melatonin

## 2021-09-08 NOTE — ED Notes (Signed)
Updated pt's husband on pt's condition he verbalized understanding and will see her tomorrow.

## 2021-09-08 NOTE — ED Notes (Signed)
Back to xray for CXR

## 2021-09-08 NOTE — Assessment & Plan Note (Addendum)
-   PT evaluated and recommending SNF rehab placement  - working on placement - Pt received authorization to go to SNF today.

## 2021-09-08 NOTE — ED Notes (Signed)
BLE: CMS intact, strong pedal pulses, skin W&D, R hip pain worse with movement. No shortening or rotation.

## 2021-09-08 NOTE — ED Triage Notes (Signed)
Patient arrived via EMS from home. Patient states that she fell over her dog on her gravel driveway. Denies LOC or hitting head. Patient complaining of right hip pain.

## 2021-09-08 NOTE — Assessment & Plan Note (Addendum)
-   CT scan shows mildly displaced oblique fracture of right femoral neck -Ortho consulted. I confirmed with Dr. Romeo Apple, surgery planned for 09/10/21.  -pt postop s/p ORIF, now WBAT - DVT prophylaxis for 28 days after DC per Dr. Romeo Apple

## 2021-09-08 NOTE — ED Provider Notes (Signed)
[facility] EMERGENCY DEPARTMENT Provider Note   CSN: [id number] Arrival date & time: 09/08/21  1700     History  Chief Complaint  Patient presents with   Fall   Hip Pain    Lauren Bruce is a 88 y.o. female.  HPI Patient was walking outside when a dog tripped her.  She fell onto her right hip, while walking on the gravel driveway.  She was unable to get up because of right hip pain.  She also noticed some pain in her neck after the fall.  She was transferred by EMS.  She states she broke C2, 3 weeks ago when she fell.  No prodrome before the fall.  No other recent illnesses    Home Medications Prior to Admission medications   Medication Sig Start Date End Date Taking? Authorizing Provider  lansoprazole (PREVACID) 30 MG capsule Take 30 mg by mouth daily at 12 noon.   Yes [provider]  Melatonin 10 MG TABS Take 20 mg by mouth at bedtime.   Yes [provider]  Naproxen Sod-diphenhydrAMINE (ALEVE PM) 220-25 MG TABS Take by mouth.   Yes [provider]  mirtazapine (REMERON) 7.5 MG tablet Take 1 tablet (7.5 mg total) by mouth at bedtime. For sleep Patient not taking: Reported on 09/08/2021 08/16/21   Claretta Fraise, MD      Allergies    Demerol [meperidine] and Xylocaine [lidocaine]    Review of Systems   Review of Systems  Physical Exam Updated Vital Signs BP (!) 117/58   Pulse 66   Temp 98.5 F (36.9 C) (Oral)   Resp 20   Ht '5\' 3"'$  (1.6 m)   Wt 57 kg   LMP 10/04/2011   SpO2 96%   BMI 22.26 kg/m  Physical Exam Vitals and nursing note reviewed.  Constitutional:      General: She is in acute distress (Uncomfortable).     Appearance: She is well-developed. She is not ill-appearing.     Comments: Frail  HENT:     Head: Normocephalic and atraumatic.     Right Ear: External ear normal.     Left Ear: External ear normal.     Nose: No congestion.     Mouth/Throat:     Mouth: Mucous membranes are moist.  Eyes:     Conjunctiva/sclera:  Conjunctivae normal.     Pupils: Pupils are equal, round, and reactive to light.  Neck:     Trachea: Phonation normal.     Comments: Tender posterior neck without step-off Cardiovascular:     Rate and Rhythm: Normal rate and regular rhythm.     Heart sounds: Normal heart sounds.  Pulmonary:     Effort: Pulmonary effort is normal.     Breath sounds: Normal breath sounds.  Chest:     Chest wall: No tenderness.  Abdominal:     General: There is no distension.     Palpations: Abdomen is soft.     Tenderness: There is no abdominal tenderness.  Musculoskeletal:        General: Normal range of motion.     Cervical back: Normal range of motion.     Comments: Deformity right proximal femur, and unable to move the right hip, actively or passively secondary to pain at the site.  No shortening of the right leg.  No deformities of the arms or left leg  Skin:    General: Skin is warm and dry.  Neurological:     Mental  Status: She is alert and oriented to person, place, and time.     Cranial Nerves: No cranial nerve deficit.     Sensory: No sensory deficit.     Motor: No abnormal muscle tone.     Coordination: Coordination normal.  Psychiatric:        Mood and Affect: Mood normal.        Behavior: Behavior normal.        Thought Content: Thought content normal.        Judgment: Judgment normal.     ED Results / Procedures / Treatments   Labs (all labs ordered are listed, but only abnormal results are displayed) Labs Reviewed  BASIC METABOLIC PANEL - Abnormal; Notable for the following components:      Result Value   Glucose, Bld 117 (*)    All other components within normal limits  CBC WITH DIFFERENTIAL/PLATELET - Abnormal; Notable for the following components:   WBC 11.5 (*)    Platelets 407 (*)    Neutro Abs 8.6 (*)    All other components within normal limits  COMPREHENSIVE METABOLIC PANEL - Abnormal; Notable for the following components:   Glucose, Bld 116 (*)    Total  Protein 6.0 (*)    Albumin 3.2 (*)    AST 14 (*)    Anion gap 4 (*)    All other components within normal limits  CBC WITH DIFFERENTIAL/PLATELET - Abnormal; Notable for the following components:   RBC 3.77 (*)    Hemoglobin 11.2 (*)    HCT 34.8 (*)    All other components within normal limits  PROTIME-INR  MAGNESIUM  URINALYSIS, ROUTINE W REFLEX MICROSCOPIC  TYPE AND SCREEN    EKG EKG Interpretation  Date/Time:  Wednesday September 08 2021 20:43:25 EDT Ventricular Rate:  100 PR Interval:  184 QRS Duration: 84 QT Interval:  339 QTC Calculation: 438 R Axis:   78 Text Interpretation: Sinus tachycardia Since last tracing rate faster Otherwise no significant change Confirmed by Daleen Bo [phone]) on 09/08/2021 11:53:55 PM  Radiology CT HIP RIGHT WO CONTRAST  Result Date: 09/08/2021 CLINICAL DATA:  Trip and fall today right hip pain. EXAM: CT OF THE RIGHT HIP WITHOUT CONTRAST TECHNIQUE: Multidetector CT imaging of the right hip was performed according to the standard protocol. Multiplanar CT image reconstructions were also generated. RADIATION DOSE REDUCTION: This exam was performed according to the departmental dose-optimization program which includes automated exposure control, adjustment of the mA and/or kV according to patient size and/or use of iterative reconstruction technique. COMPARISON:  Radiographs performed earlier on the same date. FINDINGS: Bones/Joint/Cartilage There is a mildly displaced oblique fracture of the right femoral neck. There is mild anterolateral displacement of the distal femur. Femoral head is located within the acetabulum. No other appreciable fracture. Mild right hip osteoarthritis with joint space narrowing and marginal osteophytes. Pubic symphysis and right sacroiliac joint is maintained. Ligaments Suboptimally assessed by CT. Muscles and Tendons Muscles are normal in bulk. No intramuscular hematoma or fluid collection. The tendons are intact. Soft tissues Skin  and subcutaneous soft tissues are within normal limits. IMPRESSION: 1. Mildly displaced oblique fracture of the right femoral neck. 2. No other appreciable fracture.  Mild right hip osteoarthritis. 3. Muscles, tendons and subcutaneous soft tissues are unremarkable. Electronically Signed   By: Keane Police D.O.   On: 09/08/2021 20:08   CT CERVICAL SPINE WO CONTRAST  Result Date: 09/08/2021 CLINICAL DATA:  Polytrauma.  Fall. EXAM: CT CERVICAL SPINE WITHOUT  CONTRAST TECHNIQUE: Multidetector CT imaging of the cervical spine was performed without intravenous contrast. Multiplanar CT image reconstructions were also generated. RADIATION DOSE REDUCTION: This exam was performed according to the departmental dose-optimization program which includes automated exposure control, adjustment of the mA and/or kV according to patient size and/or use of iterative reconstruction technique. COMPARISON:  Cervical spine CT 08/11/2021. FINDINGS: Alignment: Anatomic. Skull base and vertebrae: Again seen is a fracture of the right lateral mass of C2 extending into the posterior aspect of the vertebral body. Fracture fragments are distracted 2 mm. Degree of distraction has mildly increased compared to the prior study. Nondisplaced fracture of the left C2 pedicle and facet appears unchanged from the prior examination No new fractures are seen. Soft tissues and spinal canal: No prevertebral fluid or swelling. No visible canal hematoma. Disc levels: Disc space narrowing compatible with degenerative change at C5-C6 and C6-C7 appears unchanged. No severe central canal or neural foraminal stenosis identified. Upper chest: Negative. Other: Peripherally calcified nodule in the left thyroid gland measuring 15 mm is unchanged. IMPRESSION: 1. Again seen is right lateral mass of C2 fracture extending into the posterior aspect of the vertebral body. Degree of distraction measures 2 mm in this has mildly increased from prior. 2. Stable appearance of  nondisplaced left C2 pedicle and facet fracture. 3. No malalignment.  No additional fractures. Electronically Signed   By: Ronney Asters M.D.   On: 09/08/2021 20:06   CT HEAD WO CONTRAST  Result Date: 09/08/2021 CLINICAL DATA:  Fall EXAM: CT HEAD WITHOUT CONTRAST TECHNIQUE: Contiguous axial images were obtained from the base of the skull through the vertex without intravenous contrast. RADIATION DOSE REDUCTION: This exam was performed according to the departmental dose-optimization program which includes automated exposure control, adjustment of the mA and/or kV according to patient size and/or use of iterative reconstruction technique. COMPARISON:  CT 08/11/2021 FINDINGS: Brain: No acute territorial infarction, hemorrhage or intracranial mass. Mild atrophy. Mild chronic small vessel ischemic changes of the white matter. Nonenlarged ventricles Vascular: No hyperdense vessels.  Carotid vascular calcification Skull: Normal. Negative for fracture or focal lesion. Sinuses/Orbits: No acute finding. Other: None IMPRESSION: 1. No CT evidence for acute intracranial abnormality. 2. Mild atrophy and chronic small vessel ischemic changes of the white matter Electronically Signed   By: Donavan Foil M.D.   On: 09/08/2021 20:02   DG Pelvis 1-2 Views  Result Date: 09/08/2021 CLINICAL DATA:  Fall with right hip pain EXAM: PELVIS - 1-2 VIEW COMPARISON:  08/11/2021 FINDINGS: Pubic symphysis is intact. The rami are intact. No malalignment. No definitive fracture seen. IMPRESSION: No definitive acute osseous abnormality but refer to the dedicated right femur report. Electronically Signed   By: Donavan Foil M.D.   On: 09/08/2021 18:48   DG Chest 1 View  Result Date: 09/08/2021 CLINICAL DATA:  Fall EXAM: CHEST  1 VIEW COMPARISON:  08/11/2021 FINDINGS: The heart size and mediastinal contours are within normal limits. Aortic atherosclerosis. Both lungs are clear. The visualized skeletal structures are unremarkable. IMPRESSION:  No active disease. Electronically Signed   By: Donavan Foil M.D.   On: 09/08/2021 18:45   DG FEMUR, MIN 2 VIEWS RIGHT  Result Date: 09/08/2021 CLINICAL DATA:  [place] [address] in driveway severe pain in the right hip EXAM: RIGHT FEMUR 2 VIEWS COMPARISON:  None Available. FINDINGS: No malalignment. Subtle cortex irregularity at the femoral neck suggesting possible nondisplaced femoral neck fracture. There are degenerative changes. IMPRESSION: Potential nondisplaced fracture at the right femoral neck. Recommend  CT for further evaluation Electronically Signed   By: Donavan Foil M.D.   On: 09/08/2021 18:43    Procedures Procedures    Medications Ordered in ED Medications  0.9 %  sodium chloride infusion ( Intravenous Rate/Dose Change 09/09/21 1038)  melatonin tablet 18 mg (18 mg Oral Given 09/08/21 2300)  diphenhydrAMINE (BENADRYL) injection 25 mg (has no administration in time range)  pantoprazole (PROTONIX) EC tablet 40 mg (40 mg Oral Not Given 09/09/21 1024)  acetaminophen (TYLENOL) tablet 650 mg (has no administration in time range)    Or  acetaminophen (TYLENOL) suppository 650 mg (has no administration in time range)  oxyCODONE (Oxy IR/ROXICODONE) immediate release tablet 5 mg (has no administration in time range)  ondansetron (ZOFRAN) tablet 4 mg (has no administration in time range)    Or  ondansetron (ZOFRAN) injection 4 mg (has no administration in time range)  morphine (PF) 2 MG/ML injection 1 mg (has no administration in time range)  fentaNYL (SUBLIMAZE) injection 50 mcg (50 mcg Intravenous Given 09/08/21 2054)  ondansetron (ZOFRAN) injection 4 mg (4 mg Intravenous Given 09/08/21 1849)  HYDROmorphone (DILAUDID) injection 1 mg (1 mg Intravenous Given 09/08/21 2151)    ED Course/ Medical Decision Making/ A&P                           Medical Decision Making Fall, mechanical, tripped by dog, injuring right hip and neck.  Recent cervical fracture, treated nonsurgically  Problems Addressed: Closed  fracture of right hip, initial encounter [facility]): acute illness or injury that poses a threat to life or bodily functions Closed nondisplaced fracture of second cervical vertebra with routine healing, unspecified fracture morphology, subsequent encounter:    Details: Subacute injury, patient possibly reinjured today with fell while not wearing her cervical collar.  Very slight widening of the prior fracture plane.  No associated neurologic abnormality.  Collar replaced to help stabilize the fracture.  Amount and/or Complexity of Data Reviewed Independent Historian:     Details: She is a cogent historian Labs: ordered.    Details: CBC, metabolic panel, INR, magnesium, urinalysis-normal except hemoglobin, glucose high, protein low, albumin low Radiology: ordered and independent interpretation performed.    Details: Chest x-ray, pelvis, right hip radiograph, CT right hip-no pneumonia or heart failure.  Initial pelvis and right hip x-ray did not reveal fracture.  CT hip indicates femoral neck fracture right.  This is nondisplaced. ECG/medicine tests: ordered and independent interpretation performed.    Details: Cardiac monitor-normal sinus rhythm Discussion of management or test interpretation with external provider(s): Case discussed with orthopedics, Dr. Aline Brochure who accepts patient as a consult.  He will have to confer with anesthesia relative to whether or not she can be managed at this facility with her cervical fracture, for right hip fracture repair.  Case discussed with hospitalist will admit the patient.  Plan on admitting to [facility] at this time.  Risk Prescription drug management. Decision regarding hospitalization. Elective major surgery with identified risk factors. Risk Details: Fall likely mechanical with subsequent right hip fracture.  Possible minimal change in C2 fracture however no associated neurologic abnormality.  Cervical collar replaced.  She had not been wearing it during  the initial injury.  She will require operative management for the hip fracture.  Potential surgery complication due to cervical fracture.  She may require spinal anesthesia.  Patient requires hospitalization for management of these multiple problems.  Critical Care Total time providing critical care:  40 minutes           Final Clinical Impression(s) / ED Diagnoses Final diagnoses:  Closed fracture of right hip, initial encounter ([place])  Closed nondisplaced fracture of second cervical vertebra with routine healing, unspecified fracture morphology, subsequent encounter    Rx / DC Orders ED Discharge Orders     None         Daleen Bo, MD 09/09/21 1215

## 2021-09-08 NOTE — ED Notes (Signed)
Pt alert, NAD, calm, interactive. Blood sent to lab. Pt to radiology for CTs/ xrays.
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
## 2021-09-08 NOTE — Assessment & Plan Note (Addendum)
-   Oxygen sats dropped from 95% to 88% after morphine -received initial O2 supplementation but now weaned to room air -she has remained stable since supplemental oxygen started -continue to follow -weaned oxygen to room air

## 2021-09-08 NOTE — Assessment & Plan Note (Signed)
Continue PPI ?

## 2021-09-09 ENCOUNTER — Encounter (HOSPITAL_COMMUNITY): Payer: Self-pay | Admitting: Family Medicine

## 2021-09-09 DIAGNOSIS — Z66 Do not resuscitate: Secondary | ICD-10-CM

## 2021-09-09 DIAGNOSIS — W19XXXA Unspecified fall, initial encounter: Secondary | ICD-10-CM | POA: Diagnosis not present

## 2021-09-09 DIAGNOSIS — S72001A Fracture of unspecified part of neck of right femur, initial encounter for closed fracture: Secondary | ICD-10-CM | POA: Diagnosis not present

## 2021-09-09 DIAGNOSIS — J9601 Acute respiratory failure with hypoxia: Secondary | ICD-10-CM | POA: Diagnosis not present

## 2021-09-09 LAB — CBC WITH DIFFERENTIAL/PLATELET
Abs Immature Granulocytes: 0.03 10*3/uL (ref 0.00–0.07)
Basophils Absolute: 0 10*3/uL (ref 0.0–0.1)
Basophils Relative: 0 %
Eosinophils Absolute: 0.1 10*3/uL (ref 0.0–0.5)
Eosinophils Relative: 1 %
HCT: 34.8 % — ABNORMAL LOW (ref 36.0–46.0)
Hemoglobin: 11.2 g/dL — ABNORMAL LOW (ref 12.0–15.0)
Immature Granulocytes: 0 %
Lymphocytes Relative: 17 %
Lymphs Abs: 1.5 10*3/uL (ref 0.7–4.0)
MCH: 29.7 pg (ref 26.0–34.0)
MCHC: 32.2 g/dL (ref 30.0–36.0)
MCV: 92.3 fL (ref 80.0–100.0)
Monocytes Absolute: 0.8 10*3/uL (ref 0.1–1.0)
Monocytes Relative: 9 %
Neutro Abs: 6.5 10*3/uL (ref 1.7–7.7)
Neutrophils Relative %: 73 %
Platelets: 327 10*3/uL (ref 150–400)
RBC: 3.77 MIL/uL — ABNORMAL LOW (ref 3.87–5.11)
RDW: 12.1 % (ref 11.5–15.5)
WBC: 8.9 10*3/uL (ref 4.0–10.5)
nRBC: 0 % (ref 0.0–0.2)

## 2021-09-09 LAB — SURGICAL PCR SCREEN
MRSA, PCR: NEGATIVE
Staphylococcus aureus: NEGATIVE

## 2021-09-09 LAB — COMPREHENSIVE METABOLIC PANEL
ALT: 11 U/L (ref 0–44)
AST: 14 U/L — ABNORMAL LOW (ref 15–41)
Albumin: 3.2 g/dL — ABNORMAL LOW (ref 3.5–5.0)
Alkaline Phosphatase: 71 U/L (ref 38–126)
Anion gap: 4 — ABNORMAL LOW (ref 5–15)
BUN: 10 mg/dL (ref 8–23)
CO2: 28 mmol/L (ref 22–32)
Calcium: 8.9 mg/dL (ref 8.9–10.3)
Chloride: 105 mmol/L (ref 98–111)
Creatinine, Ser: 0.77 mg/dL (ref 0.44–1.00)
GFR, Estimated: >60 mL/min (ref >60)
Glucose, Bld: 116 mg/dL — ABNORMAL HIGH (ref 70–99)
Potassium: 4.7 mmol/L (ref 3.5–5.1)
Sodium: 137 mmol/L (ref 135–145)
Total Bilirubin: 0.8 mg/dL (ref 0.3–1.2)
Total Protein: 6 g/dL — ABNORMAL LOW (ref 6.5–8.1)

## 2021-09-09 LAB — MAGNESIUM: Magnesium: 2.1 mg/dL (ref 1.7–2.4)

## 2021-09-09 MED ORDER — MORPHINE SULFATE (PF) 2 MG/ML IV SOLN
1.0000 mg | INTRAVENOUS | Status: DC | PRN
  Administered 2021-09-09 – 2021-09-10 (×6): 1 mg via INTRAVENOUS
  Filled 2021-09-09 (×7): qty 1

## 2021-09-09 MED ORDER — ORAL CARE MOUTH RINSE
15.0000 mL | Freq: Two times a day (BID) | OROMUCOSAL | Status: DC
  Administered 2021-09-09 – 2021-09-14 (×9): 15 mL via OROMUCOSAL

## 2021-09-09 MED ORDER — MUPIROCIN 2 % EX OINT
1.0000 "application " | TOPICAL_OINTMENT | Freq: Two times a day (BID) | CUTANEOUS | Status: DC
  Administered 2021-09-09: 1 via NASAL
  Filled 2021-09-09: qty 22

## 2021-09-09 NOTE — Assessment & Plan Note (Signed)
--  continue DNR order while in hospital  ?
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
## 2021-09-09 NOTE — TOC Progression Note (Signed)
  Transition of Care [facility] Screening Note   Patient Details  Name: Lauren Bruce Date of Birth: 1933/08/30   Transition of [facility]) CM/SW Contact:    Boneta Lucks, RN Phone Number: 09/09/2021, 9:52 AM    Transition of Care Department [facility]) has reviewed patient and no TOC needs have been identified at this time. We will continue to monitor patient advancement through interdisciplinary progression rounds. If new patient transition needs arise, please place a TOC consult.     Expected Discharge Plan: Home/Self Care Barriers to Discharge: Continued Medical Work up  Expected Discharge Plan and Services Expected Discharge Plan: Home/Self Care

## 2021-09-10 ENCOUNTER — Encounter (HOSPITAL_COMMUNITY)
Admission: EM | Disposition: A | Discharge status: Skilled Nursing Facility | Payer: Self-pay | Source: Home / Self Care | Attending: Family Medicine

## 2021-09-10 ENCOUNTER — Other Ambulatory Visit: Payer: Self-pay

## 2021-09-10 ENCOUNTER — Inpatient Hospital Stay (HOSPITAL_COMMUNITY): Payer: Medicare Other

## 2021-09-10 ENCOUNTER — Inpatient Hospital Stay (HOSPITAL_COMMUNITY): Payer: Medicare Other | Admitting: Certified Registered"

## 2021-09-10 ENCOUNTER — Encounter (HOSPITAL_COMMUNITY): Payer: Self-pay | Admitting: Family Medicine

## 2021-09-10 DIAGNOSIS — S72001A Fracture of unspecified part of neck of right femur, initial encounter for closed fracture: Secondary | ICD-10-CM

## 2021-09-10 DIAGNOSIS — J9601 Acute respiratory failure with hypoxia: Secondary | ICD-10-CM | POA: Diagnosis not present

## 2021-09-10 DIAGNOSIS — W19XXXA Unspecified fall, initial encounter: Secondary | ICD-10-CM | POA: Diagnosis not present

## 2021-09-10 DIAGNOSIS — S12100A Unspecified displaced fracture of second cervical vertebra, initial encounter for closed fracture: Secondary | ICD-10-CM | POA: Diagnosis present

## 2021-09-10 DIAGNOSIS — Z66 Do not resuscitate: Secondary | ICD-10-CM | POA: Diagnosis not present

## 2021-09-10 DIAGNOSIS — M1990 Unspecified osteoarthritis, unspecified site: Secondary | ICD-10-CM

## 2021-09-10 DIAGNOSIS — F05 Delirium due to known physiological condition: Secondary | ICD-10-CM | POA: Diagnosis present

## 2021-09-10 HISTORY — PX: COMPRESSION HIP SCREW: SHX1386

## 2021-09-10 SURGERY — COMPRESSION HIP
Anesthesia: Spinal | Site: Hip | Laterality: Right

## 2021-09-10 MED ORDER — DOCUSATE SODIUM 100 MG PO CAPS
100.0000 mg | ORAL_CAPSULE | Freq: Two times a day (BID) | ORAL | Status: DC
  Administered 2021-09-10 – 2021-09-14 (×8): 100 mg via ORAL
  Filled 2021-09-10 (×8): qty 1

## 2021-09-10 MED ORDER — ONDANSETRON HCL 4 MG/2ML IJ SOLN
INTRAMUSCULAR | Status: AC
  Filled 2021-09-10: qty 2

## 2021-09-10 MED ORDER — PHENYLEPHRINE 80 MCG/ML (10ML) SYRINGE FOR IV PUSH (FOR BLOOD PRESSURE SUPPORT)
PREFILLED_SYRINGE | INTRAVENOUS | Status: DC | PRN
  Administered 2021-09-10 (×2): 80 ug via INTRAVENOUS
  Administered 2021-09-10: 160 ug via INTRAVENOUS
  Administered 2021-09-10 (×2): 80 ug via INTRAVENOUS

## 2021-09-10 MED ORDER — POLYETHYLENE GLYCOL 3350 17 G PO PACK
17.0000 g | PACK | Freq: Every day | ORAL | Status: DC | PRN
  Administered 2021-09-11 – 2021-09-12 (×2): 17 g via ORAL
  Filled 2021-09-10 (×2): qty 1

## 2021-09-10 MED ORDER — PHENYLEPHRINE 80 MCG/ML (10ML) SYRINGE FOR IV PUSH (FOR BLOOD PRESSURE SUPPORT)
PREFILLED_SYRINGE | INTRAVENOUS | Status: AC
  Filled 2021-09-10: qty 10

## 2021-09-10 MED ORDER — GLYCOPYRROLATE PF 0.2 MG/ML IJ SOSY
PREFILLED_SYRINGE | INTRAMUSCULAR | Status: AC
  Filled 2021-09-10: qty 1

## 2021-09-10 MED ORDER — DEXAMETHASONE SODIUM PHOSPHATE 10 MG/ML IJ SOLN
INTRAMUSCULAR | Status: AC
  Filled 2021-09-10: qty 1

## 2021-09-10 MED ORDER — PROPOFOL 10 MG/ML IV BOLUS
INTRAVENOUS | Status: DC | PRN
  Administered 2021-09-10 (×3): 20 mg via INTRAVENOUS

## 2021-09-10 MED ORDER — PROPOFOL 500 MG/50ML IV EMUL
INTRAVENOUS | Status: DC | PRN
  Administered 2021-09-10: 30 ug/kg/min via INTRAVENOUS

## 2021-09-10 MED ORDER — POVIDONE-IODINE 10 % EX SWAB: 2.0000 "application " | Freq: Once | CUTANEOUS | Status: DC

## 2021-09-10 MED ORDER — METHOCARBAMOL 500 MG PO TABS: 500.0000 mg | ORAL_TABLET | Freq: Four times a day (QID) | ORAL | Status: DC | PRN

## 2021-09-10 MED ORDER — LACTATED RINGERS IV SOLN: INTRAVENOUS | Status: DC

## 2021-09-10 MED ORDER — ENOXAPARIN SODIUM 30 MG/0.3ML IJ SOSY
30.0000 mg | PREFILLED_SYRINGE | INTRAMUSCULAR | Status: DC
  Administered 2021-09-11: 30 mg via SUBCUTANEOUS
  Filled 2021-09-10: qty 0.3

## 2021-09-10 MED ORDER — SODIUM CHLORIDE 0.9 % IV SOLN: INTRAVENOUS | Status: DC

## 2021-09-10 MED ORDER — DEXAMETHASONE SODIUM PHOSPHATE 10 MG/ML IJ SOLN
INTRAMUSCULAR | Status: DC | PRN
  Administered 2021-09-10: 4 mg via INTRAVENOUS

## 2021-09-10 MED ORDER — PHENOL 1.4 % MT LIQD: 1.0000 | OROMUCOSAL | Status: DC | PRN

## 2021-09-10 MED ORDER — ONDANSETRON HCL 4 MG/2ML IJ SOLN: 4.0000 mg | Freq: Four times a day (QID) | INTRAMUSCULAR | Status: DC | PRN

## 2021-09-10 MED ORDER — PHENYLEPHRINE HCL (PRESSORS) 10 MG/ML IV SOLN
INTRAVENOUS | Status: AC
  Filled 2021-09-10: qty 1

## 2021-09-10 MED ORDER — ACETAMINOPHEN 500 MG PO TABS
500.0000 mg | ORAL_TABLET | Freq: Four times a day (QID) | ORAL | Status: AC
  Administered 2021-09-10 – 2021-09-11 (×4): 500 mg via ORAL
  Filled 2021-09-10 (×4): qty 1

## 2021-09-10 MED ORDER — MORPHINE SULFATE (PF) 2 MG/ML IV SOLN
0.5000 mg | INTRAVENOUS | Status: DC | PRN
  Administered 2021-09-11 – 2021-09-14 (×2): 0.5 mg via INTRAVENOUS
  Filled 2021-09-10 (×2): qty 1

## 2021-09-10 MED ORDER — ROCURONIUM BROMIDE 10 MG/ML (PF) SYRINGE
PREFILLED_SYRINGE | INTRAVENOUS | Status: AC
  Filled 2021-09-10: qty 10

## 2021-09-10 MED ORDER — HYDROCODONE-ACETAMINOPHEN 5-325 MG PO TABS
1.0000 | ORAL_TABLET | ORAL | Status: DC | PRN
  Administered 2021-09-12 – 2021-09-13 (×3): 1 via ORAL
  Administered 2021-09-13: 2 via ORAL
  Filled 2021-09-10: qty 1
  Filled 2021-09-10: qty 2
  Filled 2021-09-10 (×2): qty 1

## 2021-09-10 MED ORDER — MENTHOL 3 MG MT LOZG: 1.0000 | LOZENGE | OROMUCOSAL | Status: DC | PRN

## 2021-09-10 MED ORDER — METHOCARBAMOL 1000 MG/10ML IJ SOLN: 500.0000 mg | Freq: Four times a day (QID) | INTRAVENOUS | Status: DC | PRN

## 2021-09-10 MED ORDER — CHLORHEXIDINE GLUCONATE CLOTH 2 % EX PADS
6.0000 | MEDICATED_PAD | Freq: Every day | CUTANEOUS | Status: DC
  Administered 2021-09-11 – 2021-09-14 (×4): 6 via TOPICAL

## 2021-09-10 MED ORDER — ONDANSETRON HCL 4 MG PO TABS: 4.0000 mg | ORAL_TABLET | Freq: Four times a day (QID) | ORAL | Status: DC | PRN

## 2021-09-10 MED ORDER — CEFAZOLIN SODIUM-DEXTROSE 2-4 GM/100ML-% IV SOLN
2.0000 g | Freq: Four times a day (QID) | INTRAVENOUS | Status: AC
  Administered 2021-09-10 – 2021-09-11 (×2): 2 g via INTRAVENOUS
  Filled 2021-09-10: qty 100

## 2021-09-10 MED ORDER — ONDANSETRON HCL 4 MG/2ML IJ SOLN
INTRAMUSCULAR | Status: DC | PRN
  Administered 2021-09-10: 4 mg via INTRAVENOUS

## 2021-09-10 MED ORDER — METOCLOPRAMIDE HCL 10 MG PO TABS: 5.0000 mg | ORAL_TABLET | Freq: Three times a day (TID) | ORAL | Status: DC | PRN

## 2021-09-10 MED ORDER — BUPIVACAINE IN DEXTROSE 0.75-8.25 % IT SOLN
INTRATHECAL | Status: DC | PRN
  Administered 2021-09-10: 1.6 mL via INTRATHECAL

## 2021-09-10 MED ORDER — CEFAZOLIN SODIUM-DEXTROSE 2-4 GM/100ML-% IV SOLN
2.0000 g | INTRAVENOUS | Status: AC
  Administered 2021-09-10: 2 g via INTRAVENOUS
  Filled 2021-09-10: qty 100

## 2021-09-10 MED ORDER — METOCLOPRAMIDE HCL 5 MG/ML IJ SOLN: 5.0000 mg | Freq: Three times a day (TID) | INTRAMUSCULAR | Status: DC | PRN

## 2021-09-10 MED ORDER — TRAMADOL HCL 50 MG PO TABS
50.0000 mg | ORAL_TABLET | Freq: Four times a day (QID) | ORAL | Status: DC
  Administered 2021-09-10 – 2021-09-12 (×8): 50 mg via ORAL
  Filled 2021-09-10 (×8): qty 1

## 2021-09-10 MED ORDER — 0.9 % SODIUM CHLORIDE (POUR BTL) OPTIME
TOPICAL | Status: DC | PRN
  Administered 2021-09-10: 1000 mL

## 2021-09-10 MED ORDER — LIDOCAINE HCL (PF) 2 % IJ SOLN
INTRAMUSCULAR | Status: AC
  Filled 2021-09-10: qty 10

## 2021-09-10 MED ORDER — CHLORHEXIDINE GLUCONATE 4 % EX LIQD
60.0000 mL | Freq: Once | CUTANEOUS | Status: AC
  Administered 2021-09-10: 4 via TOPICAL
  Filled 2021-09-10: qty 60

## 2021-09-10 MED ORDER — PHENYLEPHRINE HCL-NACL 20-0.9 MG/250ML-% IV SOLN
INTRAVENOUS | Status: DC | PRN
  Administered 2021-09-10: 40 ug/min via INTRAVENOUS

## 2021-09-10 SURGICAL SUPPLY — 64 items
APL PRP STRL LF DISP 70% ISPRP (MISCELLANEOUS) ×1
APL SKNCLS STERI-STRIP NONHPOA (GAUZE/BANDAGES/DRESSINGS) ×1
BENZOIN TINCTURE PRP APPL 2/3 (GAUZE/BANDAGES/DRESSINGS) ×1 IMPLANT
BIT DRILL CANN LRG QC 5X300 (BIT) ×1 IMPLANT
BIT DRILL TWIST 3.5MM (BIT) ×1 IMPLANT
BLADE SURG SZ10 CARB STEEL (BLADE) ×4 IMPLANT
BNDG GAUZE ELAST 4 BULKY (GAUZE/BANDAGES/DRESSINGS) ×3 IMPLANT
CHLORAPREP W/TINT 26 (MISCELLANEOUS) ×2 IMPLANT
CLOSURE STERI STRIP 1/2 X4 (GAUZE/BANDAGES/DRESSINGS) ×1 IMPLANT
CLOTH BEACON ORANGE TIMEOUT ST (SAFETY) ×2 IMPLANT
COVER LIGHT HANDLE STERIS (MISCELLANEOUS) ×4 IMPLANT
COVER MAYO STAND XLG (MISCELLANEOUS) ×2 IMPLANT
COVER PERINEAL POST (MISCELLANEOUS) ×2 IMPLANT
DRAPE STERI IOBAN 125X83 (DRAPES) ×2 IMPLANT
DRILL OMEGA BONE (BIT) IMPLANT
DRILL TWIST 3.5MM (BIT) ×2
DRILL TWIST HELICAL 1/4IN (BIT) IMPLANT
DRSG MEPILEX BORDER 4X12 (GAUZE/BANDAGES/DRESSINGS) ×2 IMPLANT
DRSG MEPILEX SACRM 8.7X9.8 (GAUZE/BANDAGES/DRESSINGS) ×2 IMPLANT
ELECT REM PT RETURN 9FT ADLT (ELECTROSURGICAL) ×2
ELECTRODE REM PT RTRN 9FT ADLT (ELECTROSURGICAL) ×1 IMPLANT
GLOVE BIOGEL PI IND STRL 7.0 (GLOVE) ×2 IMPLANT
GLOVE BIOGEL PI INDICATOR 7.0 (GLOVE) ×2
GLOVE SS N UNI LF 8.5 STRL (GLOVE) ×2 IMPLANT
GLOVE SURG POLYISO LF SZ8 (GLOVE) ×2 IMPLANT
GOWN STRL REUS W/TWL LRG LVL3 (GOWN DISPOSABLE) ×2 IMPLANT
GOWN STRL REUS W/TWL XL LVL3 (GOWN DISPOSABLE) ×2 IMPLANT
GUIDE PIN 3.2X230MM (PIN) ×4
GUIDEWIRE THREADED 2.8 (WIRE) ×1 IMPLANT
INST SET MAJOR BONE (KITS) ×2 IMPLANT
KIT BLADEGUARD II DBL (SET/KITS/TRAYS/PACK) ×2 IMPLANT
KIT TURNOVER CYSTO (KITS) ×2 IMPLANT
MANIFOLD NEPTUNE II (INSTRUMENTS) ×2 IMPLANT
MARKER SKIN DUAL TIP RULER LAB (MISCELLANEOUS) ×2 IMPLANT
NDL HYPO 21X1.5 SAFETY (NEEDLE) ×1 IMPLANT
NDL SPNL 18GX3.5 QUINCKE PK (NEEDLE) ×1 IMPLANT
NEEDLE HYPO 21X1.5 SAFETY (NEEDLE) ×2 IMPLANT
NEEDLE SPNL 18GX3.5 QUINCKE PK (NEEDLE) ×2 IMPLANT
NS IRRIG 1000ML POUR BTL (IV SOLUTION) ×2 IMPLANT
PACK BASIC III (CUSTOM PROCEDURE TRAY) ×2
PACK SRG BSC III STRL LF ECLPS (CUSTOM PROCEDURE TRAY) ×1 IMPLANT
PAD ABD 5X9 TENDERSORB (GAUZE/BANDAGES/DRESSINGS) ×3 IMPLANT
PAD ARMBOARD 7.5X6 YLW CONV (MISCELLANEOUS) ×2 IMPLANT
PENCIL SMOKE EVACUATOR COATED (MISCELLANEOUS) ×2 IMPLANT
PIN GUIDE 3.2X230MM (PIN) ×1 IMPLANT
PLATE 130 DEGREE 2HOLE (Plate) ×1 IMPLANT
SCREW CANN 6.5X80MM (Screw) ×1 IMPLANT
SCREW COMPRESSION 19.0M (Screw) ×1 IMPLANT
SCREW CORTICAL SFTP 4.5X34MM (Screw) ×1 IMPLANT
SCREW CORTICAL SFTP 4.5X38MM (Screw) ×1 IMPLANT
SCREW LAG 95MM (Screw) ×2 IMPLANT
SCREW LAGSTD 95X21X12.7X9 (Screw) IMPLANT
SET BASIN LINEN APH (SET/KITS/TRAYS/PACK) ×2 IMPLANT
SPONGE T-LAP 18X18 ~~LOC~~+RFID (SPONGE) ×3 IMPLANT
STAPLER VISISTAT 35W (STAPLE) ×2 IMPLANT
SUT BRALON NAB BRD #1 30IN (SUTURE) ×4 IMPLANT
SUT MNCRL 0 VIOLET CTX 36 (SUTURE) ×1 IMPLANT
SUT MON AB 2-0 CT1 36 (SUTURE) ×1 IMPLANT
SUT MONOCRYL 0 CTX 36 (SUTURE) ×2
SYR 20ML LL LF (SYRINGE) ×4 IMPLANT
SYR BULB IRRIG 60ML STRL (SYRINGE) ×3 IMPLANT
TRAY FOLEY MTR SLVR 16FR STAT (SET/KITS/TRAYS/PACK) ×2 IMPLANT
WASHER FOR 5.0 SCREWS (Washer) ×1 IMPLANT
YANKAUER SUCT BULB TIP 10FT TU (MISCELLANEOUS) ×2 IMPLANT
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
## 2021-09-10 NOTE — Interval H&P Note (Signed)
History and Physical Interval Note:  09/10/2021 1:04 PM  Lauren Bruce  has presented today for surgery, with the diagnosis of Right hip fracture.  The various methods of treatment have been discussed with the patient and family. After consideration of risks, benefits and other options for treatment, the patient has consented to  Procedure(s): COMPRESSION HIP (Right) as a surgical intervention.  The patient's history has been reviewed, patient examined, no change in status, stable for surgery.  I have reviewed the patient's chart and labs.  Questions were answered to the patient's satisfaction.     Arther Abbott

## 2021-09-10 NOTE — Anesthesia Procedure Notes (Signed)
Spinal  Patient location during procedure: OR Start time: 09/10/2021 1:16 PM End time: 09/10/2021 1:22 PM Reason for block: surgical anesthesia Staffing Performed: resident/CRNA  Resident/CRNA: Genelle Bal, CRNA Performed by: Eulas Post, Einar Nolasco W, CRNA Authorized by: Louann Sjogren, MD   Preanesthetic Checklist Completed: patient identified, IV checked, site marked, risks and benefits discussed, surgical consent, monitors and equipment checked, pre-op evaluation and timeout performed Spinal Block Patient position: right lateral decubitus Prep: DuraPrep Patient monitoring: heart rate, cardiac monitor, continuous pulse ox and blood pressure Approach: midline Location: L3-4 Injection technique: single-shot Needle Needle type: Sprotte  Needle gauge: 24 G Needle length: 9 cm Assessment Sensory level: T4 Events: CSF return Additional Notes Sedation started, pt positioned right side down. Spinal attempt x1, + clear, free-flowing CSF. Easy to aspirate/inject. - heme/paresthesia.

## 2021-09-10 NOTE — TOC Progression Note (Signed)
  Transition of Care [facility]) Screening Note   Patient Details  Name: Lauren Bruce Bruce of Birth: March 05, 1934   Transition of Care [facility]) CM/SW Contact:    Boneta Lucks, RN Phone Number: 09/10/2021, 9:43 AM  OR today, PT eval pending for discharge planning HH VS SNF  Transition of Care Department (TOC) has reviewed patient and no TOC needs have been identified at this time. We will continue to monitor patient advancement through interdisciplinary progression rounds. If new patient transition needs arise, please place a TOC consult.     Expected Discharge Plan: Home/Self Care Barriers to Discharge: Continued Medical Work up  Expected Discharge Plan and Services Expected Discharge Plan: Home/Self Care
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
## 2021-09-10 NOTE — Plan of Care (Signed)
  Problem: Education: Goal: Knowledge of General Education information will improve Description: Including pain rating scale, medication(s)/side effects and non-pharmacologic comfort measures Outcome: Progressing   Problem: Clinical Measurements: Goal: Ability to maintain clinical measurements within normal limits will improve Outcome: Progressing Goal: Diagnostic test results will improve Outcome: Progressing   

## 2021-09-10 NOTE — Progress Notes (Signed)
Pt anxious and restless throughout the night, with periods of agitation non compliance. Yelling out at times, attempting to get up unassisted, requiring frequent redirection and reorientation to situation. Pt verbalized being "afraid to be alone". Emotional support and reassurance provided, with frequent checks noted to ease pt's anxiety. Pt responding better to one to one interaction. Non compliant with cervical collar at times, removing several times during periods of agitation, despite encouragement from staff and education on importance of wearing brace. Resting in brief intervals, given prn pain medication every 3-4 hours, pt noticeable less anxious after medication administration, partial effects  noted. Pt pulled out IV this am, allowed this writer to replace another IV in arm. Pt resting at this time.

## 2021-09-10 NOTE — Progress Notes (Signed)
Patient arrived back in room. VSS. Patient very cold so was given multiple warm blankets. She requested pain medication and has taken her collar off. Bed alarm on and call bell within reach. Husband is in room with patient.

## 2021-09-10 NOTE — Care Management Important Message (Signed)
Important Message  Patient Details  Name: ELLARY CASAMENTO MRN: [id number] Date of Birth: May 26, 1933   Medicare Important Message Given:  N/A - LOS <3 / Initial given by admissions     Dannette Barbara 09/10/2021, 8:34 AM

## 2021-09-10 NOTE — Progress Notes (Addendum)
Pt removed c-collar in pre op. States she has been taking it off periodically at home. Pt encouraged to place back on.
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
## 2021-09-10 NOTE — Anesthesia Preprocedure Evaluation (Signed)
Anesthesia Evaluation  Patient identified by MRN, date of birth, ID band Patient awake    Reviewed: Allergy & Precautions, NPO status , Patient's Chart, lab work & pertinent test results  History of Anesthesia Complications (+) PONV and history of anesthetic complications  Airway Mallampati: II  TM Distance: >3 FB Neck ROM: Full    Dental no notable dental hx.    Pulmonary neg pulmonary ROS, former smoker,    Pulmonary exam normal breath sounds clear to auscultation       Cardiovascular negative cardio ROS Normal cardiovascular exam Rhythm:Regular Rate:Normal     Neuro/Psych negative neurological ROS  negative psych ROS   GI/Hepatic Neg liver ROS, GERD  ,  Endo/Other  negative endocrine ROS  Renal/GU negative Renal ROS  negative genitourinary   Musculoskeletal  (+) Arthritis ,   Abdominal   Peds negative pediatric ROS (+)  Hematology negative hematology ROS (+)   Anesthesia Other Findings Pt. With C2 cervical fracture.  Cleared by neurosurgery to proceed wit C-collar on.  Reproductive/Obstetrics negative OB ROS                             Anesthesia Physical Anesthesia Plan  ASA: 3  Anesthesia Plan: Spinal   Post-op Pain Management:    Induction:   PONV Risk Score and Plan: 3 and Propofol infusion  Airway Management Planned: Nasal Cannula  Additional Equipment:   Intra-op Plan:   Post-operative Plan:   Informed Consent:   Plan Discussed with: Surgeon and CRNA  Anesthesia Plan Comments: (Keep C-collar on. If intubation is needed emergently, maintain cervical traction. Risks discussed with patient.)        Anesthesia Quick Evaluation

## 2021-09-10 NOTE — Assessment & Plan Note (Signed)
--   observed delirium precautions -- trial of seroquel before bed in hospital only

## 2021-09-10 NOTE — Transfer of Care (Signed)
Immediate Anesthesia Transfer of Care Note  Patient: Lauren Bruce  Procedure(s) Performed: Open Treatment Internal Fixation of Right Hip (Right: Hip)  Patient Location: PACU  Anesthesia Type:MAC and Spinal  Level of Consciousness: awake, alert  and oriented  Airway & Oxygen Therapy: Patient Spontanous Breathing and Patient connected to face mask oxygen  Post-op Assessment: Report given to RN and Post -op Vital signs reviewed and stable  Post vital signs: Reviewed and stable  Last Vitals:  Vitals Value Taken Time  BP 120/74   Temp    Pulse 82   Resp 14   SpO2 96%     Last Pain:  Vitals:   09/10/21 1104  TempSrc: Oral  PainSc:       Patients Stated Pain Goal: 0 (27/63/94 3200)  Complications: No notable events documented.

## 2021-09-10 NOTE — Brief Op Note (Signed)
09/10/2021  3:41 PM  PATIENT:  Lauren Bruce  88 y.o. female  PRE-OPERATIVE DIAGNOSIS:  Right hip fracture femoral neck Powells grade 3  POST-OPERATIVE DIAGNOSIS:  Right hip fracture femoral neck Powell grade 3  PROCEDURE:  Procedure(s): Open Treatment Internal Fixation of Right Hip (Right)  Implants Compression screw and sideplate [employer] 2 hole 130 degree plate, with compression screw, 2 femoral screws, 1 Synthes 6.5 cannulated screw with washer  Findings nondisplaced oblique Powells grade 3 femoral neck fracture   SURGEON:  Surgeon(s) and Role:    Carole Civil, MD - Primary  PHYSICIAN ASSISTANT:   ASSISTANTS: none   ANESTHESIA:   spinal  EBL:  40 mL   BLOOD ADMINISTERED:none  DRAINS: none   LOCAL MEDICATIONS USED:  NONE  SPECIMEN:  No Specimen  DISPOSITION OF SPECIMEN:  N/A  COUNTS:  YES  TOURNIQUET:  * No tourniquets in log *  DICTATION: .Dragon Dictation  PLAN OF CARE: Admit to inpatient   PATIENT DISPOSITION:  PACU - hemodynamically stable.   Delay start of Pharmacological VTE agent (>24hrs) due to surgical blood loss or risk of bleeding: yes

## 2021-09-11 DIAGNOSIS — Z66 Do not resuscitate: Secondary | ICD-10-CM | POA: Diagnosis not present

## 2021-09-11 DIAGNOSIS — W19XXXA Unspecified fall, initial encounter: Secondary | ICD-10-CM | POA: Diagnosis not present

## 2021-09-11 DIAGNOSIS — J9601 Acute respiratory failure with hypoxia: Secondary | ICD-10-CM | POA: Diagnosis not present

## 2021-09-11 DIAGNOSIS — S72001A Fracture of unspecified part of neck of right femur, initial encounter for closed fracture: Secondary | ICD-10-CM | POA: Diagnosis not present

## 2021-09-11 LAB — BASIC METABOLIC PANEL
Anion gap: 4 — ABNORMAL LOW (ref 5–15)
BUN: 13 mg/dL (ref 8–23)
CO2: 27 mmol/L (ref 22–32)
Calcium: 8.7 mg/dL — ABNORMAL LOW (ref 8.9–10.3)
Chloride: 102 mmol/L (ref 98–111)
Creatinine, Ser: 0.66 mg/dL (ref 0.44–1.00)
GFR, Estimated: >60 mL/min (ref >60)
Glucose, Bld: 105 mg/dL — ABNORMAL HIGH (ref 70–99)
Potassium: 3.8 mmol/L (ref 3.5–5.1)
Sodium: 133 mmol/L — ABNORMAL LOW (ref 135–145)

## 2021-09-11 LAB — CBC
HCT: 30.2 % — ABNORMAL LOW (ref 36.0–46.0)
Hemoglobin: 10 g/dL — ABNORMAL LOW (ref 12.0–15.0)
MCH: 29.8 pg (ref 26.0–34.0)
MCHC: 33.1 g/dL (ref 30.0–36.0)
MCV: 89.9 fL (ref 80.0–100.0)
Platelets: 256 10*3/uL (ref 150–400)
RBC: 3.36 MIL/uL — ABNORMAL LOW (ref 3.87–5.11)
RDW: 12 % (ref 11.5–15.5)
WBC: 8.6 10*3/uL (ref 4.0–10.5)
nRBC: 0 % (ref 0.0–0.2)

## 2021-09-11 MED ORDER — ENOXAPARIN SODIUM 40 MG/0.4ML IJ SOSY
40.0000 mg | PREFILLED_SYRINGE | INTRAMUSCULAR | Status: DC
  Administered 2021-09-12 – 2021-09-14 (×3): 40 mg via SUBCUTANEOUS
  Filled 2021-09-11 (×3): qty 0.4

## 2021-09-11 MED ORDER — QUETIAPINE FUMARATE 25 MG PO TABS
25.0000 mg | ORAL_TABLET | Freq: Every day | ORAL | Status: DC
  Administered 2021-09-11 – 2021-09-13 (×3): 25 mg via ORAL
  Filled 2021-09-11 (×3): qty 1
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
## 2021-09-11 NOTE — Progress Notes (Signed)
Patient ID: Lauren Bruce, female   DOB: 1933-07-11, 88 y.o.   MRN: [id number]  BP (!) 104/57 (BP Location: Right Arm)   Pulse 70   Temp 98.5 F (36.9 C) (Oral)   Resp 18   Ht '5\' 3"'$  (1.6 m)   Wt 57 kg   LMP 10/04/2011   SpO2 94%   BMI 22.26 kg/m   Status post ORIF right hip with compression hip screw  Patient did well today was able to get up with physical therapy therapist thought she did very well but still will need SNF  Hemoglobin is 10  Resting is dry, calf soft and supple no Homans' sign  Continue physical therapy discharge when bed available  Postop plan the patient can be full weightbearing  X-ray can be performed in the office in 4 weeks  DVT prophylaxis for 4 weeks  Neurosurgery indicated they need to see her in 4 weeks as well
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
## 2021-09-11 NOTE — Plan of Care (Signed)
  Problem: Acute Rehab PT Goals(only PT should resolve) Goal: Pt Will Go Supine/Side To Sit Outcome: Progressing Flowsheets (Taken 09/11/2021 1115) Pt will go Supine/Side to Sit: with min guard assist Goal: Patient Will Transfer Sit To/From Stand Outcome: Progressing Flowsheets (Taken 09/11/2021 1115) Patient will transfer sit to/from stand: with min guard assist Goal: Pt Will Transfer Bed To Chair/Chair To Bed Outcome: Progressing Flowsheets (Taken 09/11/2021 1115) Pt will Transfer Bed to Chair/Chair to Bed: min guard assist Goal: Pt Will Ambulate Outcome: Progressing Flowsheets (Taken 09/11/2021 1115) Pt will Ambulate:  10 feet  with minimal assist  with min guard assist  with rolling walker

## 2021-09-12 DIAGNOSIS — Z66 Do not resuscitate: Secondary | ICD-10-CM | POA: Diagnosis not present

## 2021-09-12 DIAGNOSIS — S72001A Fracture of unspecified part of neck of right femur, initial encounter for closed fracture: Secondary | ICD-10-CM | POA: Diagnosis not present

## 2021-09-12 DIAGNOSIS — W19XXXA Unspecified fall, initial encounter: Secondary | ICD-10-CM | POA: Diagnosis not present

## 2021-09-12 DIAGNOSIS — J9601 Acute respiratory failure with hypoxia: Secondary | ICD-10-CM | POA: Diagnosis not present

## 2021-09-12 LAB — CBC
HCT: 30.5 % — ABNORMAL LOW (ref 36.0–46.0)
Hemoglobin: 10.1 g/dL — ABNORMAL LOW (ref 12.0–15.0)
MCH: 30.1 pg (ref 26.0–34.0)
MCHC: 33.1 g/dL (ref 30.0–36.0)
MCV: 91 fL (ref 80.0–100.0)
Platelets: 276 10*3/uL (ref 150–400)
RBC: 3.35 MIL/uL — ABNORMAL LOW (ref 3.87–5.11)
RDW: 12.1 % (ref 11.5–15.5)
WBC: 8.7 10*3/uL (ref 4.0–10.5)
nRBC: 0 % (ref 0.0–0.2)

## 2021-09-12 LAB — BASIC METABOLIC PANEL
Anion gap: 6 (ref 5–15)
BUN: 17 mg/dL (ref 8–23)
CO2: 27 mmol/L (ref 22–32)
Calcium: 8.6 mg/dL — ABNORMAL LOW (ref 8.9–10.3)
Chloride: 100 mmol/L (ref 98–111)
Creatinine, Ser: 0.68 mg/dL (ref 0.44–1.00)
GFR, Estimated: >60 mL/min (ref >60)
Glucose, Bld: 98 mg/dL (ref 70–99)
Potassium: 4 mmol/L (ref 3.5–5.1)
Sodium: 133 mmol/L — ABNORMAL LOW (ref 135–145)

## 2021-09-12 MED ORDER — GUAIFENESIN ER 600 MG PO TB12
600.0000 mg | ORAL_TABLET | Freq: Two times a day (BID) | ORAL | Status: DC
  Administered 2021-09-12 – 2021-09-14 (×4): 600 mg via ORAL
  Filled 2021-09-12 (×4): qty 1

## 2021-09-12 MED ORDER — TRAMADOL HCL 50 MG PO TABS
25.0000 mg | ORAL_TABLET | Freq: Four times a day (QID) | ORAL | Status: DC
  Administered 2021-09-12 – 2021-09-14 (×7): 25 mg via ORAL
  Filled 2021-09-12 (×7): qty 1

## 2021-09-12 MED ORDER — GUAIFENESIN 100 MG/5ML PO LIQD: 5.0000 mL | ORAL | Status: DC | PRN
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
## 2021-09-12 NOTE — Anesthesia Postprocedure Evaluation (Signed)
Anesthesia Post Note  Patient: Lauren Bruce  Procedure(s) Performed: Open Treatment Internal Fixation of Right Hip (Right: Hip)  Patient location during evaluation: Phase II Anesthesia Type: Spinal Level of consciousness: awake Pain management: pain level controlled Vital Signs Assessment: post-procedure vital signs reviewed and stable Respiratory status: spontaneous breathing and respiratory function stable Cardiovascular status: blood pressure returned to baseline and stable Postop Assessment: no headache and no apparent nausea or vomiting Anesthetic complications: no Comments: Late entry   No notable events documented.   Last Vitals:  Vitals:   09/11/21 2050 09/12/21 0314  BP: 119/72 129/63  Pulse: 73 66  Resp: 18 18  Temp: 36.7 C 36.4 C  SpO2: (!) 82% (!) 82%    Last Pain:  Vitals:   09/12/21 0551  TempSrc:   PainSc: 0-No pain                 Louann Sjogren

## 2021-09-12 NOTE — Progress Notes (Signed)
Patient ID: Lauren Bruce, female   DOB: 21-Jan-1934, 88 y.o.   MRN: [id number]   POD 2 S/P ORIF RT HIP C2 FRX   BP 129/63 (BP Location: Right Arm)   Pulse 66   Temp 97.6 F (36.4 C) (Oral)   Resp 18   Ht '5\' 3"'$  (1.6 m)   Wt 57 kg   LMP 10/04/2011   SpO2 (!) 82%   BMI 22.26 kg/m   COUGH, SEEMS TO HAVE LOTS OF PHLEGM  WBAT  FU 4 WEEKS  DVT PREV FOR 28 DAYS AFTER DC  TODAY  -PT -GUAIFENESIN

## 2021-09-13 ENCOUNTER — Encounter (HOSPITAL_COMMUNITY): Payer: Self-pay | Admitting: Orthopedic Surgery

## 2021-09-13 NOTE — TOC Progression Note (Signed)
Transition of Care [facility]) - Progression Note    Patient Details  Name: Lauren Bruce MRN: [id number] Date of Birth: 31-Jul-1933  [facility] CM/SW Contact  Salome Arnt, [place] Phone Number: 09/13/2021, 11:05 AM  Clinical Narrative: Pt's husband chooses [place]. Facility notified. CMA starting authorization.       Expected Discharge Plan: [place] Barriers to Discharge: Continued Medical Work up  Expected Discharge Plan and Services Expected Discharge Plan: [place] In-house Referral: Clinical Social Work   Post Acute Care Choice: [place] Living arrangements for the past 2 months: Single Family Home                                       Social Determinants of Health (SDOH) Interventions    Readmission Risk Interventions     No data to display

## 2021-09-13 NOTE — NC FL2 (Signed)
[place] LEVEL OF CARE SCREENING TOOL     IDENTIFICATION  Patient Name: Lauren Bruce Birthdate: 1933/08/05 Sex: female Admission Date (Current Location): 09/08/2021  [facility] and [place] Number:  [employer] and Address:  [place] [address] [place]      Provider Number: [id number]  Attending Physician Name and Address:  Murlean Iba, MD  Relative Name and Phone Number:       Current Level of Care: Hospital Recommended Level of Care: [place] Prior Approval Number:    Date Approved/Denied:   PASRR Number: [id number]  Discharge Plan: SNF    Current Diagnoses: Patient Active Problem List   Diagnosis Date Noted   Sundowning 09/10/2021   C2 cervical fracture ([place]) 09/10/2021   DNR (do not resuscitate) 09/09/2021   Hip fracture ([place]) 09/08/2021   GERD (gastroesophageal reflux disease) 09/08/2021   Acute respiratory failure with hypoxia ([place]) 09/08/2021   Insomnia 02/12/2019   Fall 06/29/2015   Arterial hypotension    Dyslipidemia 10/02/2014    Orientation RESPIRATION BLADDER Height & Weight     Self, Place  Normal Incontinent Weight: 125 lb 10.6 oz (57 kg) Height:  '5\' 3"'$  (160 cm)  BEHAVIORAL SYMPTOMS/MOOD NEUROLOGICAL BOWEL NUTRITION STATUS      Incontinent Diet (Regular. See d/c summary for updates.)  AMBULATORY STATUS COMMUNICATION OF NEEDS Skin   Extensive Assist Verbally Surgical wounds, Bruising                       Personal Care Assistance Level of Assistance  Bathing, Dressing, Feeding Bathing Assistance: Maximum assistance Feeding assistance: Limited assistance Dressing Assistance: Maximum assistance     Functional Limitations Info  Sight, Hearing, Speech Sight Info: Impaired Hearing Info: Adequate Speech Info: Adequate    SPECIAL CARE FACTORS FREQUENCY  PT (By licensed PT)     PT Frequency: 5x weekly              Contractures      Additional  Factors Info  Code Status, Allergies Code Status Info: DNR Allergies Info: Demerol (meperidine), Xylocaine (lidocaine)           Current Medications (09/13/2021):  This is the current hospital active medication list Current Facility-Administered Medications  Medication Dose Route Frequency Provider Last Rate Last Admin   Chlorhexidine Gluconate Cloth 2 % PADS 6 each  6 each Topical Daily Wynetta Emery, Clanford L, MD   6 each at 09/12/21 0947   diphenhydrAMINE (BENADRYL) injection 25 mg  25 mg Intravenous QHS PRN Carole Civil, MD       docusate sodium (COLACE) capsule 100 mg  100 mg Oral BID Carole Civil, MD   100 mg at 09/12/21 2033   enoxaparin (LOVENOX) injection 40 mg  40 mg Subcutaneous Q24H Johnson, Clanford L, MD   40 mg at 09/12/21 0946   guaiFENesin (MUCINEX) 12 hr tablet 600 mg  600 mg Oral BID Johnson, Clanford L, MD   600 mg at 09/13/21 0531   guaiFENesin (ROBITUSSIN) 100 MG/5ML liquid 5 mL  5 mL Oral Q4H PRN Carole Civil, MD       HYDROcodone-acetaminophen (NORCO/VICODIN) 5-325 MG per tablet 1-2 tablet  1-2 tablet Oral Q4H PRN Carole Civil, MD   1 tablet at 09/12/21 2034   MEDLINE mouth rinse  15 mL Mouth Rinse BID Carole Civil, MD   15 mL at 09/12/21 2031   melatonin tablet  18 mg  18 mg Oral QHS Carole Civil, MD   18 mg at 09/12/21 2032   menthol-cetylpyridinium (CEPACOL) lozenge 3 mg  1 lozenge Oral PRN Carole Civil, MD       Or   phenol (CHLORASEPTIC) mouth spray 1 spray  1 spray Mouth/Throat PRN Carole Civil, MD       methocarbamol (ROBAXIN) tablet 500 mg  500 mg Oral Q6H PRN Carole Civil, MD       Or   methocarbamol (ROBAXIN) 500 mg in dextrose 5 % 50 mL IVPB  500 mg Intravenous Q6H PRN Carole Civil, MD       metoCLOPramide (REGLAN) tablet 5-10 mg  5-10 mg Oral Q8H PRN Carole Civil, MD       Or   metoCLOPramide (REGLAN) injection 5-10 mg  5-10 mg Intravenous Q8H PRN Carole Civil, MD        morphine (PF) 2 MG/ML injection 0.5-1 mg  0.5-1 mg Intravenous Q2H PRN Carole Civil, MD   0.5 mg at 09/11/21 1832   ondansetron (ZOFRAN) tablet 4 mg  4 mg Oral Q6H PRN Carole Civil, MD       Or   ondansetron [facility]) injection 4 mg  4 mg Intravenous Q6H PRN Carole Civil, MD   4 mg at 09/09/21 1325   pantoprazole (PROTONIX) EC tablet 40 mg  40 mg Oral Daily Carole Civil, MD   40 mg at 09/12/21 0946   polyethylene glycol (MIRALAX / GLYCOLAX) packet 17 g  17 g Oral Daily PRN Carole Civil, MD   17 g at 09/12/21 0506   QUEtiapine (SEROQUEL) tablet 25 mg  25 mg Oral QHS Johnson, Clanford L, MD   25 mg at 09/12/21 2034   traMADol (ULTRAM) tablet 25 mg  25 mg Oral Q6H Johnson, Clanford L, MD   25 mg at 09/13/21 0531     Discharge Medications: Please see discharge summary for a list of discharge medications.  Relevant Imaging Results:  Relevant Lab Results:   Additional Information SSN: [id number]  Salome Arnt, LCSW
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
## 2021-09-13 NOTE — Progress Notes (Signed)
Pt took off her [place] J collar. She reports it is uncomfortable. This RN explained purpose of wearing it and reason. Pt was confused and reports she did not know she had a fracture. [place] J was reapplied. Bryson Corona Edd Fabian
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
## 2021-09-13 NOTE — TOC Initial Note (Signed)
Transition of Care [facility]) - Initial/Assessment Note    Patient Details  Name: Lauren Bruce MRN: [id number] Date of Birth: 10/05/1933  Transition of Care [facility]) CM/SW Contact:    Salome Arnt, [place] Phone Number: 09/13/2021, 8:35 AM  Clinical Narrative:  Pt admitted due to hip fracture. Assessment completed with pt's husband on phone as pt oriented to self and place only per chart. Pt's husband reports pt was independent with ADLs prior to fall. He is aware of recommendation for SNF and is agreeable. Husband requests [facility] in [place] if available. TOC will initiate bed search and insurance authorization.                  Expected Discharge Plan: Skilled Nursing Facility Barriers to Discharge: Continued Medical Work up   Patient Goals and CMS Choice Patient states their goals for this hospitalization and ongoing recovery are:: short term SNF   Choice offered to / list presented to : Spouse  Expected Discharge Plan and Services Expected Discharge Plan: [place] In-house Referral: Clinical Social Work   Post Acute Care Choice: [place] Living arrangements for the past 2 months: [place]                                      Prior Living Arrangements/Services Living arrangements for the past 2 months: Single Family Home Lives with:: Spouse Patient language and need for interpreter reviewed:: Yes Do you feel safe going back to the place where you live?: Yes      Need for Family Participation in Patient Care: Yes (Comment)     Criminal Activity/Legal Involvement Pertinent to Current Situation/Hospitalization: No - Comment as needed  Activities of Daily Living Home Assistive Devices/Equipment: Hearing aid ADL Screening (condition at time of admission) Patient's cognitive ability adequate to safely complete daily activities?: Yes Is the patient deaf or have difficulty hearing?: Yes Does the patient have difficulty  seeing, even when wearing glasses/contacts?: No Does the patient have difficulty concentrating, remembering, or making decisions?: Yes Patient able to express need for assistance with ADLs?: Yes Does the patient have difficulty dressing or bathing?: Yes Independently performs ADLs?: No Communication: Independent Dressing (OT): Needs assistance Is this a change from baseline?: Change from baseline, expected to last >3 days Grooming: Needs assistance Is this a change from baseline?: Change from baseline, expected to last <3 days Feeding: Independent Bathing: Dependent Is this a change from baseline?: Change from baseline, expected to last >3 days Toileting: Dependent Is this a change from baseline?: Change from baseline, expected to last >3days In/Out Bed: Dependent Is this a change from baseline?: Change from baseline, expected to last >3 days Walks in Home: Independent Does the patient have difficulty walking or climbing stairs?: Yes Weakness of Legs: Both Weakness of Arms/Hands: None  Permission Sought/Granted                  Emotional Assessment   Attitude/Demeanor/Rapport: Unable to Assess Affect (typically observed): Unable to Assess Orientation: : Oriented to Self, Oriented to Place Alcohol / Substance Use: Not Applicable Psych Involvement: No (comment)  Admission diagnosis:  Hip fracture ([place]) [S72.009A] Closed fracture of right hip, initial encounter ([place]) [S72.001A] Closed nondisplaced fracture of second cervical vertebra with routine healing, unspecified fracture morphology, subsequent encounter [S12.101D] Patient Active Problem List   Diagnosis Date Noted   Sundowning 09/10/2021   C2 cervical  fracture ([place]) 09/10/2021   DNR (do not resuscitate) 09/09/2021   Hip fracture ([place]) 09/08/2021   GERD (gastroesophageal reflux disease) 09/08/2021   Acute respiratory failure with hypoxia ([place]) 09/08/2021   Insomnia 02/12/2019   Fall 06/29/2015   Arterial  hypotension    Dyslipidemia 10/02/2014   PCP:  Dettinger, Fransisca Kaufmann, MD Pharmacy:   [place], [place] [address] Phone: [phone] Fax: [phone]  [id number] [address] [id number]: [phone]: [phone]    Social Determinants of Health (SDOH) Interventions    Readmission Risk Interventions     No data to display

## 2021-09-14 DIAGNOSIS — F05 Delirium due to known physiological condition: Secondary | ICD-10-CM

## 2021-09-14 DIAGNOSIS — G319 Degenerative disease of nervous system, unspecified: Secondary | ICD-10-CM | POA: Diagnosis not present

## 2021-09-14 DIAGNOSIS — G4700 Insomnia, unspecified: Secondary | ICD-10-CM | POA: Diagnosis not present

## 2021-09-14 DIAGNOSIS — R531 Weakness: Secondary | ICD-10-CM | POA: Diagnosis not present

## 2021-09-14 DIAGNOSIS — J9601 Acute respiratory failure with hypoxia: Secondary | ICD-10-CM | POA: Diagnosis not present

## 2021-09-14 DIAGNOSIS — F5104 Psychophysiologic insomnia: Secondary | ICD-10-CM | POA: Diagnosis not present

## 2021-09-14 DIAGNOSIS — E440 Moderate protein-calorie malnutrition: Secondary | ICD-10-CM | POA: Diagnosis not present

## 2021-09-14 DIAGNOSIS — R278 Other lack of coordination: Secondary | ICD-10-CM | POA: Diagnosis not present

## 2021-09-14 DIAGNOSIS — S12101D Unspecified nondisplaced fracture of second cervical vertebra, subsequent encounter for fracture with routine healing: Secondary | ICD-10-CM | POA: Diagnosis not present

## 2021-09-14 DIAGNOSIS — S72001D Fracture of unspecified part of neck of right femur, subsequent encounter for closed fracture with routine healing: Secondary | ICD-10-CM

## 2021-09-14 DIAGNOSIS — E785 Hyperlipidemia, unspecified: Secondary | ICD-10-CM | POA: Diagnosis not present

## 2021-09-14 DIAGNOSIS — M6281 Muscle weakness (generalized): Secondary | ICD-10-CM | POA: Diagnosis not present

## 2021-09-14 DIAGNOSIS — Z66 Do not resuscitate: Secondary | ICD-10-CM | POA: Diagnosis not present

## 2021-09-14 DIAGNOSIS — I341 Nonrheumatic mitral (valve) prolapse: Secondary | ICD-10-CM | POA: Diagnosis not present

## 2021-09-14 DIAGNOSIS — R269 Unspecified abnormalities of gait and mobility: Secondary | ICD-10-CM | POA: Diagnosis not present

## 2021-09-14 DIAGNOSIS — I959 Hypotension, unspecified: Secondary | ICD-10-CM | POA: Diagnosis not present

## 2021-09-14 DIAGNOSIS — I700 Atherosclerosis of aorta: Secondary | ICD-10-CM | POA: Diagnosis not present

## 2021-09-14 DIAGNOSIS — R410 Disorientation, unspecified: Secondary | ICD-10-CM | POA: Diagnosis not present

## 2021-09-14 DIAGNOSIS — Z9181 History of falling: Secondary | ICD-10-CM | POA: Diagnosis not present

## 2021-09-14 DIAGNOSIS — S72001A Fracture of unspecified part of neck of right femur, initial encounter for closed fracture: Secondary | ICD-10-CM | POA: Diagnosis not present

## 2021-09-14 DIAGNOSIS — R293 Abnormal posture: Secondary | ICD-10-CM | POA: Diagnosis not present

## 2021-09-14 DIAGNOSIS — K219 Gastro-esophageal reflux disease without esophagitis: Secondary | ICD-10-CM | POA: Diagnosis not present

## 2021-09-14 DIAGNOSIS — M159 Polyosteoarthritis, unspecified: Secondary | ICD-10-CM | POA: Diagnosis not present

## 2021-09-14 DIAGNOSIS — D62 Acute posthemorrhagic anemia: Secondary | ICD-10-CM | POA: Diagnosis not present

## 2021-09-14 DIAGNOSIS — R41841 Cognitive communication deficit: Secondary | ICD-10-CM | POA: Diagnosis not present

## 2021-09-14 MED ORDER — DOCUSATE SODIUM 100 MG PO CAPS: 100.0000 mg | ORAL_CAPSULE | Freq: Two times a day (BID) | ORAL | 0 refills | Status: DC

## 2021-09-14 MED ORDER — ENOXAPARIN SODIUM 40 MG/0.4ML IJ SOSY: 40.0000 mg | PREFILLED_SYRINGE | INTRAMUSCULAR | Status: DC

## 2021-09-14 MED ORDER — HYDROCODONE-ACETAMINOPHEN 5-325 MG PO TABS: 1.0000 | ORAL_TABLET | Freq: Four times a day (QID) | ORAL | 0 refills | Status: DC | PRN

## 2021-09-14 MED ORDER — GUAIFENESIN ER 600 MG PO TB12: 600.0000 mg | ORAL_TABLET | Freq: Two times a day (BID) | ORAL | 0 refills | Status: AC

## 2021-09-14 NOTE — Discharge Instructions (Signed)
PLEASE WEAR ASPEN COLLAR UNTIL FOLLOW UP WITH NEUROSURGERY IN 4 WEEKS.  PLEASE FOLLOW UP WITH [facility]. [facility] IN 4 WEEKS  WEIGHT BEARING AS TOLERATED.    PLEASE CONTINUE DVT PROPHYLAXIS FOR 28 DAYS  ENOXAPARIN   IMPORTANT INFORMATION: PAY CLOSE ATTENTION   PHYSICIAN DISCHARGE INSTRUCTIONS  Follow with Primary care provider  Dettinger, Fransisca Kaufmann, MD  and other consultants as instructed by your Hospitalist Physician  [place] IF SYMPTOMS COME BACK, WORSEN OR NEW PROBLEM DEVELOPS   Please note: You were cared for by a hospitalist during your hospital stay. Every effort will be made to forward records to your primary care provider.  You can request that your primary care provider send for your hospital records if they have not received them.  Once you are discharged, your primary care physician will handle any further medical issues. Please note that NO REFILLS for any discharge medications will be authorized once you are discharged, as it is imperative that you return to your primary care physician (or establish a relationship with a primary care physician if you do not have one) for your post hospital discharge needs so that they can reassess your need for medications and monitor your lab values.  Please get a complete blood count and chemistry panel checked by your Primary MD at your next visit, and again as instructed by your Primary MD.  Get Medicines reviewed and adjusted: Please take all your medications with you for your next visit with your Primary MD  Laboratory/radiological data: Please request your Primary MD to go over all hospital tests and procedure/radiological results at the follow up, please ask your primary care provider to get all Hospital records sent to his/her office.  In some cases, they will be blood work, cultures and biopsy results pending at the time of your discharge. Please request that your primary care provider  follow up on these results.  If you are diabetic, please bring your blood sugar readings with you to your follow up appointment with primary care.    Please call and make your follow up appointments as soon as possible.    Also Note the following: If you experience worsening of your admission symptoms, develop shortness of breath, life threatening emergency, suicidal or homicidal thoughts you must seek medical attention immediately by calling 911 or calling your MD immediately  if symptoms less severe.  You must read complete instructions/literature along with all the possible adverse reactions/side effects for all the Medicines you take and that have been prescribed to you. Take any new Medicines after you have completely understood and accpet all the possible adverse reactions/side effects.   Do not drive when taking Pain medications or sleeping medications (Benzodiazepines)  Do not take more than prescribed Pain, Sleep and Anxiety Medications. It is not advisable to combine anxiety,sleep and pain medications without talking with your primary care practitioner  Special Instructions: If you have smoked or chewed Tobacco  in the last 2 yrs please stop smoking, stop any regular Alcohol  and or any Recreational drug use.  Wear Seat belts while driving.  Do not drive if taking any narcotic, mind altering or controlled substances or recreational drugs or alcohol.
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
## 2021-09-14 NOTE — Care Management Important Message (Signed)
Important Message  Patient Details  Name: Lauren Bruce MRN: [id number] Date of Birth: September 09, 1933   Medicare Important Message Given:  Yes     Tommy Medal 09/14/2021, 9:06 AM

## 2021-09-14 NOTE — TOC Transition Note (Signed)
Transition of Care [facility]) - CM/SW Discharge Note   Patient Details  Name: Lauren Bruce MRN: [id number] Date of Birth: [birth year]  [facility] of [facility] CM/SW Contact:  Ihor Gully, LCSW Phone Number: 09/14/2021, 12:09 PM   Clinical Narrative:    Discharge clinicals sent to facility. RN to call report. TOC signing off.    Final next level of care: Skilled Nursing Facility Barriers to Discharge: No Barriers Identified   Patient Goals and CMS Choice Patient states their goals for this hospitalization and ongoing recovery are:: short term SNF   Choice offered to / list presented to : Spouse  Discharge Placement              Patient chooses bed at: [facility] Patient to be transferred to facility by: staff Name of family member notified: Spouse Patient and family notified of of transfer: 09/14/21  Discharge Plan and Services In-house Referral: Clinical Social Work   Post Acute Care Choice: [place]                               Social Determinants of Health (SDOH) Interventions     Readmission Risk Interventions     No data to display

## 2021-09-15 ENCOUNTER — Other Ambulatory Visit: Payer: Self-pay | Admitting: Adult Health

## 2021-09-15 ENCOUNTER — Encounter: Payer: Self-pay | Admitting: Adult Health

## 2021-09-15 ENCOUNTER — Non-Acute Institutional Stay (SKILLED_NURSING_FACILITY): Payer: Medicare Other | Admitting: Adult Health

## 2021-09-15 DIAGNOSIS — G319 Degenerative disease of nervous system, unspecified: Secondary | ICD-10-CM | POA: Diagnosis not present

## 2021-09-15 DIAGNOSIS — K5909 Other constipation: Secondary | ICD-10-CM

## 2021-09-15 DIAGNOSIS — I959 Hypotension, unspecified: Secondary | ICD-10-CM | POA: Diagnosis not present

## 2021-09-15 DIAGNOSIS — F05 Delirium due to known physiological condition: Secondary | ICD-10-CM

## 2021-09-15 DIAGNOSIS — D62 Acute posthemorrhagic anemia: Secondary | ICD-10-CM

## 2021-09-15 DIAGNOSIS — S12101D Unspecified nondisplaced fracture of second cervical vertebra, subsequent encounter for fracture with routine healing: Secondary | ICD-10-CM

## 2021-09-15 DIAGNOSIS — E440 Moderate protein-calorie malnutrition: Secondary | ICD-10-CM | POA: Insufficient documentation

## 2021-09-15 DIAGNOSIS — F5104 Psychophysiologic insomnia: Secondary | ICD-10-CM

## 2021-09-15 DIAGNOSIS — S72001D Fracture of unspecified part of neck of right femur, subsequent encounter for closed fracture with routine healing: Secondary | ICD-10-CM | POA: Diagnosis not present

## 2021-09-15 DIAGNOSIS — I700 Atherosclerosis of aorta: Secondary | ICD-10-CM

## 2021-09-15 DIAGNOSIS — K219 Gastro-esophageal reflux disease without esophagitis: Secondary | ICD-10-CM

## 2021-09-15 MED ORDER — HYDROCODONE-ACETAMINOPHEN 5-325 MG PO TABS: 1.0000 | ORAL_TABLET | Freq: Four times a day (QID) | ORAL | 0 refills | Status: DC | PRN
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
## 2021-09-16 ENCOUNTER — Encounter: Payer: Self-pay | Admitting: Internal Medicine

## 2021-09-16 ENCOUNTER — Non-Acute Institutional Stay (SKILLED_NURSING_FACILITY): Payer: Medicare Other | Admitting: Internal Medicine

## 2021-09-16 DIAGNOSIS — J9601 Acute respiratory failure with hypoxia: Secondary | ICD-10-CM | POA: Diagnosis not present

## 2021-09-16 DIAGNOSIS — S72001D Fracture of unspecified part of neck of right femur, subsequent encounter for closed fracture with routine healing: Secondary | ICD-10-CM | POA: Diagnosis not present

## 2021-09-16 DIAGNOSIS — E440 Moderate protein-calorie malnutrition: Secondary | ICD-10-CM

## 2021-09-16 DIAGNOSIS — G319 Degenerative disease of nervous system, unspecified: Secondary | ICD-10-CM

## 2021-09-16 DIAGNOSIS — D62 Acute posthemorrhagic anemia: Secondary | ICD-10-CM

## 2021-09-16 NOTE — Assessment & Plan Note (Signed)
Pre-ORIF H/H 12.1/36.9; predischarge H/H 10.1/30.5.  She is on Lovenox DVT prophylaxis for 28 days.  No bleeding dyscrasias reported at the SNF.
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
## 2021-09-16 NOTE — Assessment & Plan Note (Signed)
Total protein has dropped from 6.6 to 6.0 currently.  Albumin has decreased from 4.3 in April 2023 to 3.2.  She has interosseous wasting of the hands.  Nutrition consult at SNF.

## 2021-09-16 NOTE — Assessment & Plan Note (Addendum)
Present O2 sats excellent.  She does have rhonchi on exam suggesting bronchitis.  Nicotine gum has been initiated.  Initiate pulmonary toilet with bronchodilators.

## 2021-09-16 NOTE — Assessment & Plan Note (Signed)
PT/OT and WBAT at SNF as tolerated.  Orthopedic follow-up 10/13/2021 with Dr. Aline Brochure.

## 2021-09-16 NOTE — Assessment & Plan Note (Deleted)
Pre-ORIF H/H 12.1/36.9; predischarge H/H 10.1/30.5.  She is on Lovenox DVT prophylaxis for 28 days.  No bleeding dyscrasias reported at the SNF.

## 2021-09-16 NOTE — Assessment & Plan Note (Addendum)
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 Warrick Parisian.

## 2021-09-16 NOTE — Patient Instructions (Signed)
See assessment and plan under each diagnosis in the problem list and acutely for this visit 

## 2021-09-23 ENCOUNTER — Other Ambulatory Visit (HOSPITAL_COMMUNITY)
Admission: RE | Admit: 2021-09-23 | Discharge: 2021-09-23 | Disposition: A | Discharge status: Home / Self Care | Payer: Medicare Other | Source: Skilled Nursing Facility | Attending: Adult Health | Admitting: Adult Health

## 2021-09-23 LAB — HEMOGLOBIN AND HEMATOCRIT, BLOOD
HCT: 32.5 % — ABNORMAL LOW (ref 36.0–46.0)
Hemoglobin: 10.7 g/dL — ABNORMAL LOW (ref 12.0–15.0)

## 2021-09-30 ENCOUNTER — Encounter: Payer: Self-pay | Admitting: Adult Health

## 2021-09-30 ENCOUNTER — Non-Acute Institutional Stay (SKILLED_NURSING_FACILITY): Payer: Medicare Other | Admitting: Adult Health

## 2021-09-30 DIAGNOSIS — I700 Atherosclerosis of aorta: Secondary | ICD-10-CM

## 2021-09-30 DIAGNOSIS — E440 Moderate protein-calorie malnutrition: Secondary | ICD-10-CM | POA: Diagnosis not present

## 2021-09-30 DIAGNOSIS — G319 Degenerative disease of nervous system, unspecified: Secondary | ICD-10-CM

## 2021-09-30 NOTE — Progress Notes (Signed)
Location:  [facility] Room Number: 128/P Place of Service:  SNF (31)   CODE STATUS: DNR  Allergies  Allergen Reactions   Demerol [Meperidine] Shortness Of Breath   Xylocaine [Lidocaine] Shortness Of Breath    Occurred with dental procedure using Lido and Epinephrine. Tolerated Marcaine at [facility] without problems    Chief Complaint  Patient presents with   Acute Visit    Care plan meeting    HPI:  We have come together for her care plan meeting. BIMS 13/15 mood 3/30: trouble sleeping; some depression. She is nonambulatory with no falls. She requires limited assist with her adls. She is occasionally incontinent of bladder and bowel. Dietary: weight is 123.6 pounds; regular diet appetite 25-100%. Therapy: supervision with adls; mobility independent; transfers supervision; 200 feet with  rolling walker and supervision. She continues to be followed for her chronic illnesses including:   Aortic atherosclerosis   Neurodegenerative cognitive impairment  Moderate protein calorie malnutrition  Past Medical History:  Diagnosis Date   Anginal pain ([place])    Arthritis    Cancer ([place])    skin cancer on her left eye    Mitral valve prolapse    PONV (postoperative nausea and vomiting)     Past Surgical History:  Procedure Laterality Date   ABDOMINAL HYSTERECTOMY     ANTERIOR AND POSTERIOR REPAIR  10/11/2011   Procedure: ANTERIOR (CYSTOCELE) AND POSTERIOR REPAIR (RECTOCELE);  Surgeon: Jonnie Kind, MD;  Location: [facility];  Service: Gynecology;  Laterality: N/A;   BREAST SURGERY     benign, right, [place], [place]   CHOLECYSTECTOMY     [place] Right 09/10/2021   Procedure: Open Treatment Internal Fixation of Right Hip;  Surgeon: Carole Civil, MD;  Location: [facility];  Service: Orthopedics;  Laterality: Right;   SALPINGOOPHORECTOMY  10/11/2011   Procedure: SALPINGO OOPHERECTOMY;  Surgeon: Jonnie Kind, MD;  Location: [facility];   Service: Gynecology;  Laterality: Right;   TONSILLECTOMY     VAGINAL HYSTERECTOMY  10/11/2011   Procedure: HYSTERECTOMY VAGINAL;  Surgeon: Jonnie Kind, MD;  Location: [facility];  Service: Gynecology;  Laterality: N/A;    Social History   Socioeconomic History   Marital status: Married    Spouse name: Mortimer Fries    Number of children: 4   Years of education: Not on file   Highest education level: Not on file  Occupational History   Not on file  Tobacco Use   Smoking status: Former   Smokeless tobacco: Never  Scientific laboratory technician Use: Never used  Substance and Sexual Activity   Alcohol use: No   Drug use: No   Sexual activity: Not Currently    Birth control/protection: Surgical  Other Topics Concern   Not on file  Social History Narrative   4 children, daughter passed away    Social Determinants of Health   Financial Resource Strain: Not on file  Food Insecurity: Not on file  Transportation Needs: Not on file  Physical Activity: Not on file  Stress: Not on file  Social Connections: Not on file  Intimate Partner Violence: Not on file   Family History  Problem Relation Age of Onset   Heart disease Sister    Diabetes Brother    Heart disease Brother    Cancer Daughter        throat    Pneumonia Daughter    Heart attack Son    GI  problems Son       VITAL SIGNS BP 121/65   Pulse 72   Temp 98 F (36.7 C)   Resp 20   Ht '5\' 3"'$  (1.6 m)   Wt 123 lb 9.6 oz (56.1 kg)   LMP 10/04/2011   SpO2 98%   BMI 21.89 kg/m   Outpatient Encounter Medications as of 09/30/2021  Medication Sig   acetaminophen (TYLENOL) 650 MG CR tablet Take 650 mg by mouth every 6 (six) hours as needed for pain.   docusate sodium (COLACE) 100 MG capsule Take 1 capsule (100 mg total) by mouth 2 (two) times daily.   enoxaparin (LOVENOX) 40 MG/0.4ML injection Inject 0.4 mLs (40 mg total) into the skin daily for 28 days.   Melatonin 10 MG TABS Take 20 mg by mouth at bedtime.   nicotine (NICODERM CQ -  DOSED IN MG/24 HOURS) 21 mg/24hr patch Place 21 mg onto the skin daily.  Remove old patch prior to application of new patch - rotate sites   NON FORMULARY Diet: Regular   Nutritional Supplements (ENSURE ENLIVE PO) Take 237 mLs by mouth daily. Vanilla flavor preferred   omeprazole (PRILOSEC) 20 MG capsule Take 20 mg by mouth daily.   polyethylene glycol (MIRALAX / GLYCOLAX) 17 g packet Take 17 g by mouth daily. For constipation. Mix with at least 4 ounces of liquid and drink.   [DISCONTINUED] Amino Acids-Protein Hydrolys (FEEDING SUPPLEMENT, PRO-STAT SUGAR FREE 64,) LIQD Take 30 mLs by mouth 3 (three) times daily with meals.   [DISCONTINUED] HYDROcodone-acetaminophen (NORCO/VICODIN) 5-325 MG tablet Take 1 tablet by mouth every 6 (six) hours as needed for moderate pain.   No facility-administered encounter medications on file as of 09/30/2021.     SIGNIFICANT DIAGNOSTIC EXAMS   PREVIOUS   09-08-21: ct of head:  1. No CT evidence for acute intracranial abnormality. 2. Mild atrophy and chronic small vessel ischemic changes of the white matter  09-08-21: ct of cervical spine 1. Again seen is right lateral mass of C2 fracture extending into the posterior aspect of the vertebral body. Degree of distraction measures 2 mm in this has mildly increased from prior. 2. Stable appearance of nondisplaced left C2 pedicle and facet fracture. 3. No malalignment.  No additional fractures.   09-08-21: ct of right hip:  1. Mildly displaced oblique fracture of the right femoral neck. 2. No other appreciable fracture.  Mild right hip osteoarthritis. 3. Muscles, tendons and subcutaneous soft tissues are unremarkable.  NO NEW LABS.   LABS REVIEWED: PREVIOUS   09-08-21: wbc 11.5; hgb 12.1; hct 36.9; mcv 91.8 plt 407; glucose 117; bun 11; creat 0.88; k+ 3.8; na++ 135; ca 9.4; gfr >60 09-09-21: wbc 8.9; hgb 11.2; hct 34.8; mcv 92.3 plt 327; glucose 116; bun 10; creat 0.77; k+ 4.7; na++ 137; ca 8.9; gfr >60; protein  6.0; albumin 3.2; mag 2.1 09-12-21: wbc 8.7; hgb 10.1; hct 30.5; mcv 91.0 plt 276; glucose 98; bun 17; creat 0.68; k+ 4.0; na++ 133; ca 8.6; gfr >60   NO NEW LABS.   Review of Systems  Constitutional:  Negative for malaise/fatigue.  Respiratory:  Negative for cough and shortness of breath.   Cardiovascular:  Negative for chest pain, palpitations and leg swelling.  Gastrointestinal:  Negative for abdominal pain, constipation and heartburn.  Musculoskeletal:  Negative for back pain, joint pain and myalgias.  Skin: Negative.   Neurological:  Negative for dizziness.  Psychiatric/Behavioral:  The patient is not nervous/anxious.    Physical  Exam Constitutional:      General: She is not in acute distress.    Appearance: She is well-developed. She is not diaphoretic.     Comments: thin  Neck:     Thyroid: No thyromegaly.  Cardiovascular:     Rate and Rhythm: Normal rate and regular rhythm.     Pulses: Normal pulses.     Heart sounds: Normal heart sounds.  Pulmonary:     Effort: Pulmonary effort is normal. No respiratory distress.     Breath sounds: Normal breath sounds.  Abdominal:     General: Bowel sounds are normal. There is no distension.     Palpations: Abdomen is soft.     Tenderness: There is no abdominal tenderness.  Musculoskeletal:        General: Normal range of motion.     Cervical back: Neck supple.     Right lower leg: No edema.     Left lower leg: No edema.     Comments: Aspen collar in place Status post: right hip compression screw on 09-10-21   Lymphadenopathy:     Cervical: No cervical adenopathy.  Skin:    General: Skin is warm and dry.  Neurological:     Mental Status: She is alert. Mental status is at baseline.  Psychiatric:        Mood and Affect: Mood normal.       ASSESSMENT/ PLAN:  TODAY  Aortic atherosclerosis Neurodegenerative cognitive impairment  Moderate protein calorie malnutrition  Will continue current medication The goal of her care  is to return home in the next week Will continue therapy as directed   Time spent with patient: 40 minutes: therapy; dietary; plan of care; goals of care.    Ok Edwards NP [facility]   call [phone]

## 2021-10-01 ENCOUNTER — Other Ambulatory Visit: Payer: Self-pay | Admitting: Adult Health

## 2021-10-01 ENCOUNTER — Non-Acute Institutional Stay (SKILLED_NURSING_FACILITY): Payer: Medicare Other | Admitting: Adult Health

## 2021-10-01 ENCOUNTER — Encounter: Payer: Self-pay | Admitting: Adult Health

## 2021-10-01 DIAGNOSIS — S12101D Unspecified nondisplaced fracture of second cervical vertebra, subsequent encounter for fracture with routine healing: Secondary | ICD-10-CM

## 2021-10-01 DIAGNOSIS — S72001D Fracture of unspecified part of neck of right femur, subsequent encounter for closed fracture with routine healing: Secondary | ICD-10-CM

## 2021-10-01 DIAGNOSIS — G319 Degenerative disease of nervous system, unspecified: Secondary | ICD-10-CM | POA: Diagnosis not present

## 2021-10-01 MED ORDER — ENOXAPARIN SODIUM 40 MG/0.4ML IJ SOSY: 40.0000 mg | PREFILLED_SYRINGE | INTRAMUSCULAR | 0 refills | Status: DC

## 2021-10-01 MED ORDER — ENOXAPARIN SODIUM 40 MG/0.4ML IJ SOSY
40.0000 mg | PREFILLED_SYRINGE | INTRAMUSCULAR | 0 refills | Status: DC
Start: 2021-10-01 — End: 2021-10-01

## 2021-10-01 NOTE — Progress Notes (Signed)
Location:  [facility] Room Number: 128-P Place of Service:  SNF (31)  Provider: Ok Edwards np   PCP: Dettinger, Fransisca Kaufmann, MD Patient Care Team: Dettinger, Fransisca Kaufmann, MD as PCP - General (Family Medicine)  Extended Emergency Contact Information Primary Emergency Contact: Celene Squibb Address: [address]          [place], [place] [address] [place] Phone: [phone] Relation: Spouse Secondary Emergency Contact: Due,Dennis Address: [address]          [place], [place] [address] [place] Phone: [phone] Relation: Son  Code Status: dnr  Goals of care:  Advanced Directive information    10/01/2021   10:16 AM  Advanced Directives  Does Patient Have a Medical Advance Directive? Yes  Type of Advance Directive Out of facility DNR (pink MOST or yellow form)  Does patient want to make changes to medical advance directive? No - Patient declined  Pre-existing out of facility DNR order (yellow form or pink MOST form) Yellow form placed in chart (order not valid for inpatient use)     Allergies  Allergen Reactions   Demerol [Meperidine] Shortness Of Breath   Xylocaine [Lidocaine] Shortness Of Breath    Occurred with dental procedure using Lido and Epinephrine. Tolerated Marcaine at [facility] without problems    Chief Complaint  Patient presents with   Discharge Note    HPI:  88 y.o. female  being discharged to home with home health for pt/ot. She will not need any dme. She will need her prescriptions written and will need to follow up with her medical provider. She had been hospitalized for a right femoral neck fracture. She had previous C2 fracture. She was admitted to this facility for short term rehab. She has participated in pt/ot to improve upon her level of independence with her adls. She is now ready for discharge to home.     Past Medical History:  Diagnosis Date   Anginal pain ([place])    Arthritis     Cancer ([place])    skin cancer on her left eye    Mitral valve prolapse    PONV (postoperative nausea and vomiting)     Past Surgical History:  Procedure Laterality Date   ABDOMINAL HYSTERECTOMY     ANTERIOR AND POSTERIOR REPAIR  10/11/2011   Procedure: ANTERIOR (CYSTOCELE) AND POSTERIOR REPAIR (RECTOCELE);  Surgeon: Jonnie Kind, MD;  Location: [facility];  Service: Gynecology;  Laterality: N/A;   BREAST SURGERY     benign, right, [place]   CHOLECYSTECTOMY     [place] Right 09/10/2021   Procedure: Open Treatment Internal Fixation of Right Hip;  Surgeon: Carole Civil, MD;  Location: [facility];  Service: Orthopedics;  Laterality: Right;   SALPINGOOPHORECTOMY  10/11/2011   Procedure: SALPINGO OOPHERECTOMY;  Surgeon: Jonnie Kind, MD;  Location: [facility];  Service: Gynecology;  Laterality: Right;   TONSILLECTOMY     VAGINAL HYSTERECTOMY  10/11/2011   Procedure: HYSTERECTOMY VAGINAL;  Surgeon: Jonnie Kind, MD;  Location: [facility];  Service: Gynecology;  Laterality: N/A;      reports that she has quit smoking. She has never used smokeless tobacco. She reports that she does not drink alcohol and does not use drugs. Social History   Socioeconomic History   Marital status: Married    Spouse name: Mortimer Fries    Number of children: 4   Years of  education: Not on file   Highest education level: Not on file  Occupational History   Not on file  Tobacco Use   Smoking status: Former   Smokeless tobacco: Never  Vaping Use   Vaping Use: Never used  Substance and Sexual Activity   Alcohol use: No   Drug use: No   Sexual activity: Not Currently    Birth control/protection: Surgical  Other Topics Concern   Not on file  Social History Narrative   4 children, daughter passed away    Social Determinants of Health   Financial Resource Strain: Not on file  Food Insecurity: Not on file  Transportation Needs: Not on file  Physical Activity: Not on file   Stress: Not on file  Social Connections: Not on file  Intimate Partner Violence: Not on file   Functional Status Survey:    Allergies  Allergen Reactions   Demerol [Meperidine] Shortness Of Breath   Xylocaine [Lidocaine] Shortness Of Breath    Occurred with dental procedure using Lido and Epinephrine. Tolerated Marcaine at [facility] without problems    Pertinent  Health Maintenance Due  Topic Date Due   DEXA SCAN  Never done   INFLUENZA VACCINE  11/02/2021    Medications: Outpatient Encounter Medications as of 10/01/2021  Medication Sig   acetaminophen (TYLENOL) 650 MG CR tablet Take 650 mg by mouth every 6 (six) hours as needed for pain.   docusate sodium (COLACE) 100 MG capsule Take 1 capsule (100 mg total) by mouth 2 (two) times daily.   Melatonin 10 MG TABS Take 20 mg by mouth at bedtime.   nicotine (NICODERM CQ - DOSED IN MG/24 HOURS) 21 mg/24hr patch Place 21 mg onto the skin daily.  Remove old patch prior to application of new patch - rotate sites   NON FORMULARY Diet: Regular   Nutritional Supplements (ENSURE ENLIVE PO) Take 237 mLs by mouth daily. Vanilla flavor preferred   omeprazole (PRILOSEC) 20 MG capsule Take 20 mg by mouth daily.   polyethylene glycol (MIRALAX / GLYCOLAX) 17 g packet Take 17 g by mouth daily. For constipation. Mix with at least 4 ounces of liquid and drink.   [DISCONTINUED] enoxaparin (LOVENOX) 40 MG/0.4ML injection Inject 0.4 mLs (40 mg total) into the skin daily for 28 days.   No facility-administered encounter medications on file as of 10/01/2021.    Vitals:   10/01/21 1011  BP: 121/65  Pulse: 72  Resp: 20  Temp: 98 F (36.7 C)  SpO2: 98%  Weight: 123 lb 9.6 oz (56.1 kg)  Height: '5\' 3"'$  (1.6 m)   Body mass index is 21.89 kg/m.   SIGNIFICANT DIAGNOSTIC EXAMS   PREVIOUS   09-08-21: ct of head:  1. No CT evidence for acute intracranial abnormality. 2. Mild atrophy and chronic small vessel ischemic changes of the white matter  09-08-21:  ct of cervical spine 1. Again seen is right lateral mass of C2 fracture extending into the posterior aspect of the vertebral body. Degree of distraction measures 2 mm in this has mildly increased from prior. 2. Stable appearance of nondisplaced left C2 pedicle and facet fracture. 3. No malalignment.  No additional fractures.   09-08-21: ct of right hip:  1. Mildly displaced oblique fracture of the right femoral neck. 2. No other appreciable fracture.  Mild right hip osteoarthritis. 3. Muscles, tendons and subcutaneous soft tissues are unremarkable.  NO NEW LABS.   LABS REVIEWED: PREVIOUS   09-08-21: wbc 11.5; hgb 12.1; hct 36.9;  mcv 91.8 plt 407; glucose 117; bun 11; creat 0.88; k+ 3.8; na++ 135; ca 9.4; gfr >60 09-09-21: wbc 8.9; hgb 11.2; hct 34.8; mcv 92.3 plt 327; glucose 116; bun 10; creat 0.77; k+ 4.7; na++ 137; ca 8.9; gfr >60; protein 6.0; albumin 3.2; mag 2.1 09-12-21: wbc 8.7; hgb 10.1; hct 30.5; mcv 91.0 plt 276; glucose 98; bun 17; creat 0.68; k+ 4.0; na++ 133; ca 8.6; gfr >60   NO NEW LABS.   Review of Systems  Constitutional:  Negative for malaise/fatigue.  Respiratory:  Negative for cough and shortness of breath.   Cardiovascular:  Negative for chest pain, palpitations and leg swelling.  Gastrointestinal:  Negative for abdominal pain, constipation and heartburn.  Musculoskeletal:  Negative for back pain, joint pain and myalgias.  Skin: Negative.   Neurological:  Negative for dizziness.  Psychiatric/Behavioral:  The patient is not nervous/anxious.    Physical Exam Constitutional:      General: She is not in acute distress.    Appearance: She is well-developed. She is not diaphoretic.     Comments: thin  Neck:     Thyroid: No thyromegaly.  Cardiovascular:     Rate and Rhythm: Normal rate and regular rhythm.     Pulses: Normal pulses.     Heart sounds: Normal heart sounds.  Pulmonary:     Effort: Pulmonary effort is normal. No respiratory distress.     Breath sounds:  Normal breath sounds.  Abdominal:     General: Bowel sounds are normal. There is no distension.     Palpations: Abdomen is soft.     Tenderness: There is no abdominal tenderness.  Musculoskeletal:        General: Normal range of motion.     Cervical back: Neck supple.     Right lower leg: No edema.     Left lower leg: No edema.     Comments:  Aspen collar in place Status post: right hip compression screw on 09-10-21    Lymphadenopathy:     Cervical: No cervical adenopathy.  Skin:    General: Skin is warm and dry.  Neurological:     Mental Status: She is alert. Mental status is at baseline.  Psychiatric:        Mood and Affect: Mood normal.      Assessment/Plan:    Patient is being discharged with the following home health services:  pt/ot to evaluate and treat as indicated for gait balance strength adl training   Patient is being discharged with the following durable medical equipment:  none needed   Patient has been advised to f/u with their PCP in 1-2 weeks to for a transitions of care visit.  Social services at their facility was responsible for arranging this appointment.  Pt was provided with adequate prescriptions of noncontrolled medications to reach the scheduled appointment .  For controlled substances, a limited supply was provided as appropriate for the individual patient.  If the pt normally receives these medications from a pain clinic or has a contract with another physician, these medications should be received from that clinic or physician only).    A 30 day supply of her prescription medications have been sent to [facility]  Time spent with patient: 35 minutes: medications; home health dme   Ok Edwards NP [facility]  call [phone]

## 2021-10-04 ENCOUNTER — Encounter: Payer: Self-pay | Admitting: Adult Health

## 2021-10-04 DIAGNOSIS — K5909 Other constipation: Secondary | ICD-10-CM | POA: Diagnosis not present

## 2021-10-04 DIAGNOSIS — I959 Hypotension, unspecified: Secondary | ICD-10-CM | POA: Diagnosis not present

## 2021-10-04 DIAGNOSIS — S12101D Unspecified nondisplaced fracture of second cervical vertebra, subsequent encounter for fracture with routine healing: Secondary | ICD-10-CM | POA: Diagnosis not present

## 2021-10-04 DIAGNOSIS — K219 Gastro-esophageal reflux disease without esophagitis: Secondary | ICD-10-CM | POA: Diagnosis not present

## 2021-10-04 DIAGNOSIS — W010XXD Fall on same level from slipping, tripping and stumbling without subsequent striking against object, subsequent encounter: Secondary | ICD-10-CM | POA: Diagnosis not present

## 2021-10-04 DIAGNOSIS — I051 Rheumatic mitral insufficiency: Secondary | ICD-10-CM | POA: Diagnosis not present

## 2021-10-04 DIAGNOSIS — I700 Atherosclerosis of aorta: Secondary | ICD-10-CM | POA: Diagnosis not present

## 2021-10-04 DIAGNOSIS — D62 Acute posthemorrhagic anemia: Secondary | ICD-10-CM | POA: Diagnosis not present

## 2021-10-04 DIAGNOSIS — S72001D Fracture of unspecified part of neck of right femur, subsequent encounter for closed fracture with routine healing: Secondary | ICD-10-CM | POA: Diagnosis not present

## 2021-10-04 DIAGNOSIS — J9601 Acute respiratory failure with hypoxia: Secondary | ICD-10-CM | POA: Diagnosis not present

## 2021-10-04 DIAGNOSIS — G319 Degenerative disease of nervous system, unspecified: Secondary | ICD-10-CM | POA: Diagnosis not present

## 2021-10-04 DIAGNOSIS — M1990 Unspecified osteoarthritis, unspecified site: Secondary | ICD-10-CM | POA: Diagnosis not present

## 2021-10-04 DIAGNOSIS — F5104 Psychophysiologic insomnia: Secondary | ICD-10-CM | POA: Diagnosis not present

## 2021-10-04 DIAGNOSIS — E440 Moderate protein-calorie malnutrition: Secondary | ICD-10-CM | POA: Diagnosis not present

## 2021-10-04 DIAGNOSIS — Z9181 History of falling: Secondary | ICD-10-CM | POA: Diagnosis not present

## 2021-10-04 NOTE — Progress Notes (Signed)
Location:  [place] Room Number: 128 Place of Service:  SNF (31)  Provider: Ok Edwards np   PCP: Dettinger, Fransisca Kaufmann, MD Patient Care Team: Dettinger, Fransisca Kaufmann, MD as PCP - General (Family Medicine)  Extended Emergency Contact Information Primary Emergency Contact: Celene Squibb Address: [address]          [place], [place] [address] [place] Phone: [phone] Relation: Spouse Secondary Emergency Contact: Hodo,Dennis Address: [address]          [place], [place] [address] [place] Phone: [phone] Relation: Son  Code Status: dnr  Goals of care:  Advanced Directive information    10/01/2021   10:16 AM  Advanced Directives  Does Patient Have a Medical Advance Directive? Yes  Type of Advance Directive Out of facility DNR (pink MOST or yellow form)  Does patient want to make changes to medical advance directive? No - Patient declined  Pre-existing out of facility DNR order (yellow form or pink MOST form) Yellow form placed in chart (order not valid for inpatient use)     Allergies  Allergen Reactions  . Demerol [Meperidine] Shortness Of Breath  . Xylocaine [Lidocaine] Shortness Of Breath    Occurred with dental procedure using Lido and Epinephrine. Tolerated Marcaine at [facility] without problems    Chief Complaint  Patient presents with  . Discharge Note    HPI:  88 y.o. female      Past Medical History:  Diagnosis Date  . Anginal pain ([place])   . Arthritis   . Cancer ([place])    skin cancer on her left eye   . Mitral valve prolapse   . PONV (postoperative nausea and vomiting)     Past Surgical History:  Procedure Laterality Date  . ABDOMINAL HYSTERECTOMY    . ANTERIOR AND POSTERIOR REPAIR  10/11/2011   Procedure: ANTERIOR (CYSTOCELE) AND POSTERIOR REPAIR (RECTOCELE);  Surgeon: Jonnie Kind, MD;  Location: [facility];  Service: Gynecology;  Laterality: N/A;  . BREAST SURGERY     benign,  right, [place], [place] Right 09/10/2021   Procedure: Open Treatment Internal Fixation of Right Hip;  Surgeon: Carole Civil, MD;  Location: [facility];  Service: Orthopedics;  Laterality: Right;  . SALPINGOOPHORECTOMY  10/11/2011   Procedure: SALPINGO OOPHERECTOMY;  Surgeon: Jonnie Kind, MD;  Location: [facility];  Service: Gynecology;  Laterality: Right;  . TONSILLECTOMY    . VAGINAL HYSTERECTOMY  10/11/2011   Procedure: HYSTERECTOMY VAGINAL;  Surgeon: Jonnie Kind, MD;  Location: [facility];  Service: Gynecology;  Laterality: N/A;      reports that she has quit smoking. She has never used smokeless tobacco. She reports that she does not drink alcohol and does not use drugs. Social History   Socioeconomic History  . Marital status: Married    Spouse name: Mortimer Fries   . Number of children: 4  . Years of education: Not on file  . Highest education level: Not on file  Occupational History  . Not on file  Tobacco Use  . Smoking status: Former  . Smokeless tobacco: Never  Vaping Use  . Vaping Use: Never used  Substance and Sexual Activity  . Alcohol use: No  . Drug use: No  . Sexual activity: Not Currently    Birth control/protection: Surgical  Other Topics Concern  . Not on file  Social History Narrative   4 children, daughter passed away    Social Determinants of Health   Financial Resource Strain: Not on file  Food Insecurity: Not on file  Transportation Needs: Not on file  Physical Activity: Not on file  Stress: Not on file  Social Connections: Not on file  Intimate Partner Violence: Not on file   Functional Status Survey:    Allergies  Allergen Reactions  . Demerol [Meperidine] Shortness Of Breath  . Xylocaine [Lidocaine] Shortness Of Breath    Occurred with dental procedure using Lido and Epinephrine. Tolerated Marcaine at [facility] without problems    Pertinent  Health Maintenance Due  Topic Date Due  .  DEXA SCAN  Never done  . INFLUENZA VACCINE  11/02/2021    Medications: Outpatient Encounter Medications as of 10/01/2021  Medication Sig  . acetaminophen (TYLENOL) 650 MG CR tablet Take 650 mg by mouth every 6 (six) hours as needed for pain.  Marland Kitchen docusate sodium (COLACE) 100 MG capsule Take 1 capsule (100 mg total) by mouth 2 (two) times daily.  Marland Kitchen enoxaparin (LOVENOX) 40 MG/0.4ML injection Inject 0.4 mLs (40 mg total) into the skin daily for 11 days.  . Melatonin 10 MG TABS Take 20 mg by mouth at bedtime.  . nicotine (NICODERM CQ - DOSED IN MG/24 HOURS) 21 mg/24hr patch Place 21 mg onto the skin daily.  Remove old patch prior to application of new patch - rotate sites  . NON FORMULARY Diet: Regular  . Nutritional Supplements (ENSURE ENLIVE PO) Take 237 mLs by mouth daily. Vanilla flavor preferred  . omeprazole (PRILOSEC) 20 MG capsule Take 20 mg by mouth daily.  . polyethylene glycol (MIRALAX / GLYCOLAX) 17 g packet Take 17 g by mouth daily. For constipation. Mix with at least 4 ounces of liquid and drink.   No facility-administered encounter medications on file as of 10/01/2021.      Vitals:   10/04/21 1150  BP: 121/70  Pulse: 80  Temp: 98.1 F (36.7 C)  Weight: 123 lb 9.6 oz (56.1 kg)  Height: '5\' 3"'$  (1.6 m)   Body mass index is 21.89 kg/m.       Assessment/Plan:    Patient is being discharged with the following home health services:  pt/ot to evaluate and treat as indicated for gait balance strength adl training.   Patient is being discharged with the following durable medical equipment:  none needed   Patient has been advised to f/u with their PCP in 1-2 weeks to for a transitions of care visit.  Social services at their facility was responsible for arranging this appointment.  Pt was provided with adequate prescriptions of noncontrolled medications to reach the scheduled appointment .  For controlled substances, a limited supply was provided as appropriate for the  individual patient.  If the pt normally receives these medications from a pain clinic or has a contract with another physician, these medications should be received from that clinic or physician only).    A 30 day supply of her prescription medications have been sent to the drug store; [facility]   Time spent with patient: 35 minutes: dme; medications; home health needs.    Ok Edwards NP [facility]  call [phone]    This encounter was created in error - please disregard.

## 2021-10-06 ENCOUNTER — Ambulatory Visit (INDEPENDENT_AMBULATORY_CARE_PROVIDER_SITE_OTHER): Payer: Medicare Other | Admitting: Family Medicine

## 2021-10-06 ENCOUNTER — Encounter: Payer: Self-pay | Admitting: Family Medicine

## 2021-10-06 VITALS — BP 120/77 | HR 75 | Temp 97.6°F | Ht 63.0 in | Wt 123.0 lb

## 2021-10-06 DIAGNOSIS — S72001D Fracture of unspecified part of neck of right femur, subsequent encounter for closed fracture with routine healing: Secondary | ICD-10-CM

## 2021-10-06 DIAGNOSIS — S12101D Unspecified nondisplaced fracture of second cervical vertebra, subsequent encounter for fracture with routine healing: Secondary | ICD-10-CM | POA: Diagnosis not present
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
## 2021-10-07 LAB — CBC WITH DIFFERENTIAL/PLATELET
Basophils Absolute: 0 10*3/uL (ref 0.0–0.2)
Basos: 0 %
EOS (ABSOLUTE): 0 10*3/uL (ref 0.0–0.4)
Eos: 0 %
Hematocrit: 38.4 % (ref 34.0–46.6)
Hemoglobin: 12.8 g/dL (ref 11.1–15.9)
Immature Grans (Abs): 0 10*3/uL (ref 0.0–0.1)
Immature Granulocytes: 0 %
Lymphocytes Absolute: 1.1 10*3/uL (ref 0.7–3.1)
Lymphs: 23 %
MCH: 29.9 pg (ref 26.6–33.0)
MCHC: 33.3 g/dL (ref 31.5–35.7)
MCV: 90 fL (ref 79–97)
Monocytes Absolute: 0.6 10*3/uL (ref 0.1–0.9)
Monocytes: 13 %
Neutrophils Absolute: 2.9 10*3/uL (ref 1.4–7.0)
Neutrophils: 64 %
Platelets: 284 10*3/uL (ref 150–450)
RBC: 4.28 x10E6/uL (ref 3.77–5.28)
RDW: 12.9 % (ref 11.7–15.4)
WBC: 4.6 10*3/uL (ref 3.4–10.8)

## 2021-10-07 LAB — CMP14+EGFR
ALT: 29 IU/L (ref 0–32)
AST: 24 IU/L (ref 0–40)
Albumin/Globulin Ratio: 1.8 (ref 1.2–2.2)
Albumin: 4.1 g/dL (ref 3.6–4.6)
Alkaline Phosphatase: 146 IU/L — ABNORMAL HIGH (ref 44–121)
BUN/Creatinine Ratio: 14 (ref 12–28)
BUN: 12 mg/dL (ref 8–27)
Bilirubin Total: <0.2 mg/dL (ref 0.0–1.2)
CO2: 26 mmol/L (ref 20–29)
Calcium: 9.5 mg/dL (ref 8.7–10.3)
Chloride: 97 mmol/L (ref 96–106)
Creatinine, Ser: 0.83 mg/dL (ref 0.57–1.00)
Globulin, Total: 2.3 g/dL (ref 1.5–4.5)
Glucose: 101 mg/dL — ABNORMAL HIGH (ref 70–99)
Potassium: 5 mmol/L (ref 3.5–5.2)
Sodium: 135 mmol/L (ref 134–144)
Total Protein: 6.4 g/dL (ref 6.0–8.5)
eGFR: 69 mL/min/{1.73_m2} (ref >59)

## 2021-10-12 ENCOUNTER — Other Ambulatory Visit: Payer: Self-pay | Admitting: Student

## 2021-10-12 ENCOUNTER — Other Ambulatory Visit (HOSPITAL_COMMUNITY): Payer: Self-pay | Admitting: Student

## 2021-10-12 DIAGNOSIS — S12191A Other nondisplaced fracture of second cervical vertebra, initial encounter for closed fracture: Secondary | ICD-10-CM

## 2021-10-13 ENCOUNTER — Ambulatory Visit (INDEPENDENT_AMBULATORY_CARE_PROVIDER_SITE_OTHER): Payer: Medicare Other | Admitting: Orthopedic Surgery

## 2021-10-13 ENCOUNTER — Ambulatory Visit (INDEPENDENT_AMBULATORY_CARE_PROVIDER_SITE_OTHER): Payer: Medicare Other

## 2021-10-13 DIAGNOSIS — S72001D Fracture of unspecified part of neck of right femur, subsequent encounter for closed fracture with routine healing: Secondary | ICD-10-CM

## 2021-10-13 NOTE — Progress Notes (Signed)
Chief Complaint  Patient presents with   Routine Post Op    RT hip DOS 09/10/21    PRE-OPERATIVE DIAGNOSIS:  Right hip fracture femoral neck Powells grade 3   POST-OPERATIVE DIAGNOSIS:  Right hip fracture femoral neck Powell grade 3   PROCEDURE:  Procedure(s): Open Treatment Internal Fixation of Right Hip (Right)   Implants Compression screw and sideplate [employer] 2 hole 130 degree plate, with compression screw, 2 femoral screws, 1 Synthes 6.5 cannulated screw with washer   Findings nondisplaced oblique Powells grade 3 femoral neck fracture  Lauren Bruce is improving she has been home now she is getting therapy at home  She will see [place] in about 6 weeks for repeat x-ray  She is walking with a walker  Preop she was not using any assistive devices  Hopefully we will get her back to that walking status  So far so good

## 2021-10-18 ENCOUNTER — Encounter (HOSPITAL_COMMUNITY): Payer: Self-pay

## 2021-10-18 ENCOUNTER — Ambulatory Visit (HOSPITAL_COMMUNITY): Payer: Medicare Other

## 2021-10-21 ENCOUNTER — Ambulatory Visit (INDEPENDENT_AMBULATORY_CARE_PROVIDER_SITE_OTHER): Payer: Medicare Other

## 2021-10-21 DIAGNOSIS — E440 Moderate protein-calorie malnutrition: Secondary | ICD-10-CM | POA: Diagnosis not present

## 2021-10-21 DIAGNOSIS — K219 Gastro-esophageal reflux disease without esophagitis: Secondary | ICD-10-CM

## 2021-10-21 DIAGNOSIS — G319 Degenerative disease of nervous system, unspecified: Secondary | ICD-10-CM

## 2021-10-21 DIAGNOSIS — J9601 Acute respiratory failure with hypoxia: Secondary | ICD-10-CM | POA: Diagnosis not present

## 2021-10-21 DIAGNOSIS — I051 Rheumatic mitral insufficiency: Secondary | ICD-10-CM

## 2021-10-21 DIAGNOSIS — S72001D Fracture of unspecified part of neck of right femur, subsequent encounter for closed fracture with routine healing: Secondary | ICD-10-CM | POA: Diagnosis not present

## 2021-10-21 DIAGNOSIS — S12101D Unspecified nondisplaced fracture of second cervical vertebra, subsequent encounter for fracture with routine healing: Secondary | ICD-10-CM | POA: Diagnosis not present

## 2021-10-21 DIAGNOSIS — M1990 Unspecified osteoarthritis, unspecified site: Secondary | ICD-10-CM

## 2021-10-21 DIAGNOSIS — I700 Atherosclerosis of aorta: Secondary | ICD-10-CM

## 2021-10-21 DIAGNOSIS — I959 Hypotension, unspecified: Secondary | ICD-10-CM

## 2021-10-21 DIAGNOSIS — D62 Acute posthemorrhagic anemia: Secondary | ICD-10-CM

## 2021-10-21 DIAGNOSIS — F5104 Psychophysiologic insomnia: Secondary | ICD-10-CM

## 2021-10-21 DIAGNOSIS — K5909 Other constipation: Secondary | ICD-10-CM

## 2021-11-24 ENCOUNTER — Encounter: Payer: Medicare Other | Admitting: Orthopedic Surgery

## 2021-11-24 DIAGNOSIS — S72001D Fracture of unspecified part of neck of right femur, subsequent encounter for closed fracture with routine healing: Secondary | ICD-10-CM

## 2022-12-01 ENCOUNTER — Encounter: Payer: Self-pay | Admitting: Family

## 2022-12-01 ENCOUNTER — Ambulatory Visit (INDEPENDENT_AMBULATORY_CARE_PROVIDER_SITE_OTHER): Payer: Medicare Other | Admitting: Family

## 2022-12-01 ENCOUNTER — Other Ambulatory Visit: Payer: Self-pay | Admitting: Family

## 2022-12-01 VITALS — BP 107/73 | HR 72 | Temp 97.5°F | Ht 63.0 in | Wt 114.0 lb

## 2022-12-01 DIAGNOSIS — L409 Psoriasis, unspecified: Secondary | ICD-10-CM

## 2022-12-01 DIAGNOSIS — F17200 Nicotine dependence, unspecified, uncomplicated: Secondary | ICD-10-CM | POA: Diagnosis not present

## 2022-12-01 DIAGNOSIS — L03119 Cellulitis of unspecified part of limb: Secondary | ICD-10-CM

## 2022-12-01 MED ORDER — CEPHALEXIN 500 MG PO CAPS: 500.0000 mg | ORAL_CAPSULE | Freq: Three times a day (TID) | ORAL | 0 refills | Status: DC

## 2022-12-01 MED ORDER — CALCIPOTRIENE-BETAMETH DIPROP 0.005-0.064 % EX CREA: 1.0000 | TOPICAL_CREAM | Freq: Two times a day (BID) | CUTANEOUS | 2 refills | Status: DC

## 2022-12-01 NOTE — Telephone Encounter (Signed)
  WYNZORA 0.005-0.064 % CREA        Changed from: Calcipotriene-Betameth Diprop 0.005-0.064 % CREA    Pharmacy comment: Alternative Requested:NOT COVERED BY INSURANCE- COST IS OVER $1000.

## 2022-12-01 NOTE — Progress Notes (Signed)
Subjective:    Patient ID: Lauren Bruce, female    DOB: 1933/09/03, 89 y.o.   MRN: [id number]  Chief Complaint  Patient presents with   elbow and hands     Rash itching swollen hurts cracks sbeen foing on for 3 weeks    PT presents to the office today with rash on bilateral elbows and hands that started three weeks ago. Reports swelling, redness, itching, and pain. Denies any injury, new medications or lotions, laundry detergent. Reports she has restarted smoking recently.   She reports she has had similar rashes in the past that come and go, but this is the worse.  Rash This is a new problem. The current episode started 1 to 4 weeks ago. The problem has been gradually worsening since onset. The affected locations include the left arm, right elbow, left hand and right hand. The rash is characterized by itchiness, redness and swelling. She was exposed to nothing. Pertinent negatives include no congestion, cough, diarrhea, eye pain, fatigue, joint pain, nail changes, rhinorrhea, shortness of breath, sore throat or vomiting. Past treatments include anti-itch cream. The treatment provided mild relief.      Review of Systems  Constitutional:  Negative for fatigue.  HENT:  Negative for congestion, rhinorrhea and sore throat.   Eyes:  Negative for pain.  Respiratory:  Negative for cough and shortness of breath.   Gastrointestinal:  Negative for diarrhea and vomiting.  Musculoskeletal:  Negative for joint pain.  Skin:  Positive for rash. Negative for nail changes.  All other systems reviewed and are negative.      Objective:   Physical Exam Vitals reviewed.  Constitutional:      General: She is not in acute distress.    Appearance: She is well-developed.  HENT:     Head: Normocephalic and atraumatic.  Eyes:     Pupils: Pupils are equal, round, and reactive to light.  Neck:     Thyroid: No thyromegaly.  Cardiovascular:     Rate and Rhythm: Normal rate and regular rhythm.      Heart sounds: Normal heart sounds. No murmur heard. Pulmonary:     Effort: Pulmonary effort is normal. No respiratory distress.     Breath sounds: Normal breath sounds. No wheezing.  Abdominal:     General: Bowel sounds are normal. There is no distension.     Palpations: Abdomen is soft.     Tenderness: There is no abdominal tenderness.  Musculoskeletal:        General: No tenderness. Normal range of motion.     Cervical back: Normal range of motion and neck supple.  Skin:    General: Skin is warm and dry.     Findings: Erythema and rash present.     Comments: Erythemas scaly rash on bilateral elbows and hands and lateral left thigh  Neurological:     Mental Status: She is alert and oriented to person, place, and time.     Cranial Nerves: No cranial nerve deficit.     Deep Tendon Reflexes: Reflexes are normal and symmetric.  Psychiatric:        Behavior: Behavior normal.        Thought Content: Thought content normal.        Judgment: Judgment normal.          BP 107/73   Pulse 72   Temp (!) 97.5 F (36.4 C) (Temporal)   Ht 5\' 3"  (1.6 m)   Wt 114 lb (51.7 kg)  LMP 10/04/2011   BMI 20.19 kg/m      Assessment & Plan:  Lauren Bruce comes in today with chief complaint of elbow and hands  (Rash itching swollen hurts cracks sbeen foing on for 3 weeks )   Diagnosis and orders addressed:  1. Cellulitis of upper extremity, unspecified laterality - Anaerobic and Aerobic Culture - cephALEXin (KEFLEX) 500 MG capsule; Take 1 capsule (500 mg total) by mouth 3 (three) times daily.  Dispense: 21 capsule; Refill: 0  2. Psoriasis - Calcipotriene-Betameth Diprop 0.005-0.064 % CREA; Apply 1 Application topically 2 (two) times daily.  Dispense: 60 g; Refill: 2   3. Current smoker Smoking cessation discussed   Wound culture pending  Start keflex 500 mg TID, report worsening redness or swelling  Start Calcipotriene-Betamethasone  cream BID Smoking cessation  Follow up if  symptoms worsen or do not improve     Jannifer Rodney, FNP

## 2022-12-01 NOTE — Patient Instructions (Signed)
 Psoriasis Psoriasis is a long-term (chronic) skin condition that causes raised, red patches (plaques) to form on the skin. Plaques may show up anywhere on your body. They can be any size or shape. Symptoms of this condition range from mild to very severe. Psoriasis cannot be passed from one person to another (is not contagious). What are the causes? The exact cause of this condition is not known. Psoriasis is an autoimmune disease. With this type of disease, the body's defense system (immune system) mistakenly attacks healthy skin. As a result, too many skin cells form too quickly and create plaques. The disease may start due to a trigger, such as: Damage or trauma to the skin, such as cuts, scrapes, sunburn, and dryness. Not enough exposure to sunlight. Certain medicines. Alcohol or tobacco use. Stress. Infections. What increases the risk? You are more likely to develop this condition if you: Have a family history of psoriasis. Are obese. Are 52-68 years old. Are taking certain medicines. What are the signs or symptoms?  There are different types of psoriasis. You can have more than one type of psoriasis during your life. Sometimes, symptoms get worse for a period of time. These are called flares. Each type of psoriasis has different symptoms. Plaque psoriasis is the most common type. Symptoms include red, raised plaques with a silvery-white coating (scale) that are usually on the scalp, elbows, knees, and back. These plaques may be itchy. Your nails may be pitted and crumbly or fall off. Guttate psoriasis symptoms include small red spots that often show up on your trunk, arms, and legs. These spots may develop after you have been sick, especially with strep throat. Inverse psoriasis symptoms include plaques in your underarm area, under your breasts, or on your genitals, groin, or buttocks. Infection, friction, and heat may cause this type of psoriasis. Pustular psoriasis symptoms include  pus-filled bumps that are painful, red, and swollen on the palms of your hands or the soles of your feet. They can affect mobility. You also may feel fatigued, feverish, weak, or have no appetite. Erythrodermic psoriasis symptoms include bright red skin that may look burned. You may have a fast heartbeat and a body temperature that is too high or too low. You may be itchy or in pain. Sebopsoriasis symptoms include red plaques that have a greasy coating and are often on your scalp, forehead, and face. Psoriatic arthritis causes swollen, painful joints along with scaly skin plaques. Your nails may be pitted and crumbly or fall off. How is this diagnosed? This condition is diagnosed based on your symptoms, family history, and physical exam. Your health care provider may remove a tissue sample (biopsy) for testing. You may also be referred to a health care provider who specializes in skin diseases (dermatologist). How is this treated? There is no cure for this condition, but treatment can help manage it. Goals of treatment include: Helping your skin heal. Reducing itching and inflammation. Slowing the growth of new skin cells. Treating any related conditions. Psoriasis can add to your risk of developing other conditions, such as heart disease, high blood pressure, eye problems, and depression. Treatment varies depending on the severity of your condition. This condition may be treated with: Creams or ointments to help with symptoms. Ultraviolet ray exposure (light therapy or phototherapy). This may include natural sunlight or light therapy in a medical office. Systemic therapy medicines. These medicines can help your body better manage skin cell turnover and inflammation. Medicines may be given as pills or injections.  Biologic medicines. These medicines are usually given as injections or through an IV. These are helpful for many people with severe psoriasis, but there is an increased risk of  infection. Follow these instructions at home: Skin care Moisturize your skin as needed. Only use moisturizers that your health care provider says are okay. Apply cool, wet cloths (cold compresses) to the affected areas. This helps with itching. Do not use a hot tub or take hot showers. Take lukewarm showers and baths. Do not scratch your skin. Lifestyle Maintain a healthy weight. Eat a healthy diet that includes plenty of vegetables, fruits, whole grains, low-fat dairy products, and lean protein. Do not eat a lot of foods that are high in solid fats, added sugars, or salt. Use techniques for stress reduction, such as meditation or yoga. Do not use any products that contain nicotine or tobacco. These products include cigarettes, chewing tobacco, and vaping devices, such as e-cigarettes. If you need help quitting, ask your health care provider. Get safe exposure to the sun as told by your health care provider. This may include spending time outdoors in sunlight. Do not get sunburned. Consider joining a psoriasis support group. General instructions  Take or use over-the-counter and prescription medicines only as told by your health care provider. Keep a journal to help track what triggers an outbreak. Try to avoid any triggers. Do not drink alcohol if your health care provider tells you not to drink. See a mental health therapist if you feel sad, anxious, frustrated, and hopeless. Managing this condition can be challenging and you may feel overwhelmed and depressed. Keep all follow-up visits. Your health care provider may monitor your condition over time to make sure that it does not cause problems or get worse. Where to find support National Psoriasis Foundation: psoriasis.org Where to find more information American Academy of Dermatology: InfoExam.si Contact a health care provider if: You have a fever or chills. Your signs or symptoms gets worse. You have more redness or warmth in the  affected areas. You have new or worsening pain or stiffness in your joints. Your nails break easily or pull away from the nail bed. You feel depressed, frustrated, or hopeless about your condition. Get help right away if: You have thoughts of hurting yourself or others. Get help right away if you feel like you may hurt yourself or others, or have thoughts about taking your own life. Go to your nearest emergency room or: Call 911. Call the National Suicide Prevention Lifeline at 9065190731 or 988. This is open 24 hours a day. Text the Crisis Text Line at 380-378-4752. Summary Psoriasis is a long-term (chronic) skin condition that causes raised, red patches (plaques) to form on the skin. There is no cure for this condition, but treatment can help manage it. Treatment varies depending on the severity of your condition. Keep a journal to track what triggers an outbreak. Try to avoid any triggers. This information is not intended to replace advice given to you by your health care provider. Make sure you discuss any questions you have with your health care provider. Document Revised: 05/26/2021 Document Reviewed: 05/26/2021 Elsevier Patient Education  2024 [employer].

## 2022-12-07 LAB — ANAEROBIC AND AEROBIC CULTURE

## 2022-12-08 ENCOUNTER — Encounter: Payer: Self-pay | Admitting: Family

## 2022-12-08 ENCOUNTER — Ambulatory Visit (INDEPENDENT_AMBULATORY_CARE_PROVIDER_SITE_OTHER): Payer: Medicare Other | Admitting: Family

## 2022-12-08 VITALS — BP 121/76 | HR 75 | Temp 97.6°F | Ht 62.0 in | Wt 116.0 lb

## 2022-12-08 DIAGNOSIS — L409 Psoriasis, unspecified: Secondary | ICD-10-CM | POA: Diagnosis not present

## 2022-12-08 DIAGNOSIS — L03119 Cellulitis of unspecified part of limb: Secondary | ICD-10-CM

## 2022-12-08 MED ORDER — DOXYCYCLINE HYCLATE 100 MG PO TABS: 100.0000 mg | ORAL_TABLET | Freq: Two times a day (BID) | ORAL | 0 refills | Status: DC

## 2022-12-08 NOTE — Patient Instructions (Signed)
 Psoriasis Psoriasis is a long-term (chronic) skin condition that causes raised, red patches (plaques) to form on the skin. Plaques may show up anywhere on your body. They can be any size or shape. Symptoms of this condition range from mild to very severe. Psoriasis cannot be passed from one person to another (is not contagious). What are the causes? The exact cause of this condition is not known. Psoriasis is an autoimmune disease. With this type of disease, the body's defense system (immune system) mistakenly attacks healthy skin. As a result, too many skin cells form too quickly and create plaques. The disease may start due to a trigger, such as: Damage or trauma to the skin, such as cuts, scrapes, sunburn, and dryness. Not enough exposure to sunlight. Certain medicines. Alcohol or tobacco use. Stress. Infections. What increases the risk? You are more likely to develop this condition if you: Have a family history of psoriasis. Are obese. Are 52-68 years old. Are taking certain medicines. What are the signs or symptoms?  There are different types of psoriasis. You can have more than one type of psoriasis during your life. Sometimes, symptoms get worse for a period of time. These are called flares. Each type of psoriasis has different symptoms. Plaque psoriasis is the most common type. Symptoms include red, raised plaques with a silvery-white coating (scale) that are usually on the scalp, elbows, knees, and back. These plaques may be itchy. Your nails may be pitted and crumbly or fall off. Guttate psoriasis symptoms include small red spots that often show up on your trunk, arms, and legs. These spots may develop after you have been sick, especially with strep throat. Inverse psoriasis symptoms include plaques in your underarm area, under your breasts, or on your genitals, groin, or buttocks. Infection, friction, and heat may cause this type of psoriasis. Pustular psoriasis symptoms include  pus-filled bumps that are painful, red, and swollen on the palms of your hands or the soles of your feet. They can affect mobility. You also may feel fatigued, feverish, weak, or have no appetite. Erythrodermic psoriasis symptoms include bright red skin that may look burned. You may have a fast heartbeat and a body temperature that is too high or too low. You may be itchy or in pain. Sebopsoriasis symptoms include red plaques that have a greasy coating and are often on your scalp, forehead, and face. Psoriatic arthritis causes swollen, painful joints along with scaly skin plaques. Your nails may be pitted and crumbly or fall off. How is this diagnosed? This condition is diagnosed based on your symptoms, family history, and physical exam. Your health care provider may remove a tissue sample (biopsy) for testing. You may also be referred to a health care provider who specializes in skin diseases (dermatologist). How is this treated? There is no cure for this condition, but treatment can help manage it. Goals of treatment include: Helping your skin heal. Reducing itching and inflammation. Slowing the growth of new skin cells. Treating any related conditions. Psoriasis can add to your risk of developing other conditions, such as heart disease, high blood pressure, eye problems, and depression. Treatment varies depending on the severity of your condition. This condition may be treated with: Creams or ointments to help with symptoms. Ultraviolet ray exposure (light therapy or phototherapy). This may include natural sunlight or light therapy in a medical office. Systemic therapy medicines. These medicines can help your body better manage skin cell turnover and inflammation. Medicines may be given as pills or injections.  Biologic medicines. These medicines are usually given as injections or through an IV. These are helpful for many people with severe psoriasis, but there is an increased risk of  infection. Follow these instructions at home: Skin care Moisturize your skin as needed. Only use moisturizers that your health care provider says are okay. Apply cool, wet cloths (cold compresses) to the affected areas. This helps with itching. Do not use a hot tub or take hot showers. Take lukewarm showers and baths. Do not scratch your skin. Lifestyle Maintain a healthy weight. Eat a healthy diet that includes plenty of vegetables, fruits, whole grains, low-fat dairy products, and lean protein. Do not eat a lot of foods that are high in solid fats, added sugars, or salt. Use techniques for stress reduction, such as meditation or yoga. Do not use any products that contain nicotine or tobacco. These products include cigarettes, chewing tobacco, and vaping devices, such as e-cigarettes. If you need help quitting, ask your health care provider. Get safe exposure to the sun as told by your health care provider. This may include spending time outdoors in sunlight. Do not get sunburned. Consider joining a psoriasis support group. General instructions  Take or use over-the-counter and prescription medicines only as told by your health care provider. Keep a journal to help track what triggers an outbreak. Try to avoid any triggers. Do not drink alcohol if your health care provider tells you not to drink. See a mental health therapist if you feel sad, anxious, frustrated, and hopeless. Managing this condition can be challenging and you may feel overwhelmed and depressed. Keep all follow-up visits. Your health care provider may monitor your condition over time to make sure that it does not cause problems or get worse. Where to find support National Psoriasis Foundation: psoriasis.org Where to find more information American Academy of Dermatology: InfoExam.si Contact a health care provider if: You have a fever or chills. Your signs or symptoms gets worse. You have more redness or warmth in the  affected areas. You have new or worsening pain or stiffness in your joints. Your nails break easily or pull away from the nail bed. You feel depressed, frustrated, or hopeless about your condition. Get help right away if: You have thoughts of hurting yourself or others. Get help right away if you feel like you may hurt yourself or others, or have thoughts about taking your own life. Go to your nearest emergency room or: Call 911. Call the National Suicide Prevention Lifeline at 9065190731 or 988. This is open 24 hours a day. Text the Crisis Text Line at 380-378-4752. Summary Psoriasis is a long-term (chronic) skin condition that causes raised, red patches (plaques) to form on the skin. There is no cure for this condition, but treatment can help manage it. Treatment varies depending on the severity of your condition. Keep a journal to track what triggers an outbreak. Try to avoid any triggers. This information is not intended to replace advice given to you by your health care provider. Make sure you discuss any questions you have with your health care provider. Document Revised: 05/26/2021 Document Reviewed: 05/26/2021 Elsevier Patient Education  2024 [employer].

## 2022-12-08 NOTE — Progress Notes (Signed)
Subjective:    Patient ID: Lauren Bruce, female    DOB: Jan 22, 1934, 89 y.o.   MRN: [id number]  Chief Complaint  Patient presents with   Rash    Itches and hurts feels worse than what it did    PT presents to the office today to follow up on cellulites and porosis.  She was given Keflex TID for 7 days and started on betamethosone cream BID. She reports the rash is the same. She continues to itch and have erythemas.  Rash This is a recurrent problem. The current episode started 1 to 4 weeks ago. The problem is unchanged. The affected locations include the right elbow, left elbow, left hand and right hand (left thigh). The rash is characterized by dryness, itchiness, redness and swelling. Past treatments include anti-itch cream, moisturizer and topical steroids. The treatment provided mild relief.      Review of Systems  Skin:  Positive for rash.  All other systems reviewed and are negative.      Objective:   Physical Exam Vitals reviewed.  Constitutional:      General: She is not in acute distress.    Appearance: She is well-developed.  HENT:     Head: Normocephalic and atraumatic.  Eyes:     Pupils: Pupils are equal, round, and reactive to light.  Neck:     Thyroid: No thyromegaly.  Cardiovascular:     Rate and Rhythm: Normal rate and regular rhythm.     Heart sounds: Normal heart sounds. No murmur heard. Pulmonary:     Effort: Pulmonary effort is normal. No respiratory distress.     Breath sounds: Normal breath sounds. No wheezing.  Abdominal:     General: Bowel sounds are normal. There is no distension.     Palpations: Abdomen is soft.     Tenderness: There is no abdominal tenderness.  Musculoskeletal:        General: No tenderness. Normal range of motion.     Cervical back: Normal range of motion and neck supple.  Skin:    General: Skin is warm and dry.     Findings: Erythema and rash present.     Comments: Erythemas, scaly rash on bilateral elbows, left  shoulder, left thigh, and bilateral hands. Elbows swollen and warm to touch  Neurological:     Mental Status: She is alert and oriented to person, place, and time.     Cranial Nerves: No cranial nerve deficit.     Deep Tendon Reflexes: Reflexes are normal and symmetric.  Psychiatric:        Behavior: Behavior normal.        Thought Content: Thought content normal.        Judgment: Judgment normal.             BP 121/76   Pulse 75   Temp 97.6 F (36.4 C) (Temporal)   Ht 5\' 2"  (1.575 m)   Wt 116 lb (52.6 kg)   LMP 10/04/2011   SpO2 95%   BMI 21.22 kg/m       Assessment & Plan:  Lauren Bruce comes in today with chief complaint of Rash (Itches and hurts feels worse than what it did )   Diagnosis and orders addressed:  1. Cellulitis of upper extremity, unspecified laterality - Ambulatory referral to Dermatology - doxycycline (VIBRA-TABS) 100 MG tablet; Take 1 tablet (100 mg total) by mouth 2 (two) times daily.  Dispense: 20 tablet; Refill: 0  2. Psoriasis -  Ambulatory referral to Dermatology   Will give doxycyline since area are erythemas, warm, and swollen. Advised not to scratch. Keep clean and dry Continue betamethasone cream BID.  Referral to dermatologists pending    Jannifer Rodney, FNP

## 2023-06-15 DIAGNOSIS — W06XXXA Fall from bed, initial encounter: Secondary | ICD-10-CM | POA: Diagnosis not present

## 2023-06-15 DIAGNOSIS — I10 Essential (primary) hypertension: Secondary | ICD-10-CM | POA: Diagnosis not present

## 2023-06-15 DIAGNOSIS — I6521 Occlusion and stenosis of right carotid artery: Secondary | ICD-10-CM | POA: Diagnosis not present

## 2023-06-15 DIAGNOSIS — M4316 Spondylolisthesis, lumbar region: Secondary | ICD-10-CM | POA: Diagnosis not present

## 2023-06-15 DIAGNOSIS — E041 Nontoxic single thyroid nodule: Secondary | ICD-10-CM | POA: Diagnosis not present

## 2023-06-15 DIAGNOSIS — M4854XA Collapsed vertebra, not elsewhere classified, thoracic region, initial encounter for fracture: Secondary | ICD-10-CM | POA: Diagnosis not present

## 2023-06-15 DIAGNOSIS — S22089A Unspecified fracture of T11-T12 vertebra, initial encounter for closed fracture: Secondary | ICD-10-CM | POA: Diagnosis not present

## 2023-06-15 DIAGNOSIS — W19XXXA Unspecified fall, initial encounter: Secondary | ICD-10-CM | POA: Diagnosis not present

## 2023-06-15 DIAGNOSIS — I2510 Atherosclerotic heart disease of native coronary artery without angina pectoris: Secondary | ICD-10-CM | POA: Diagnosis not present

## 2023-06-15 DIAGNOSIS — E049 Nontoxic goiter, unspecified: Secondary | ICD-10-CM | POA: Diagnosis not present

## 2023-06-15 DIAGNOSIS — M47816 Spondylosis without myelopathy or radiculopathy, lumbar region: Secondary | ICD-10-CM | POA: Diagnosis not present

## 2023-06-15 DIAGNOSIS — M549 Dorsalgia, unspecified: Secondary | ICD-10-CM | POA: Diagnosis not present

## 2023-06-15 DIAGNOSIS — S0990XA Unspecified injury of head, initial encounter: Secondary | ICD-10-CM | POA: Diagnosis not present

## 2023-06-15 DIAGNOSIS — Z043 Encounter for examination and observation following other accident: Secondary | ICD-10-CM | POA: Diagnosis not present

## 2023-06-15 DIAGNOSIS — M47812 Spondylosis without myelopathy or radiculopathy, cervical region: Secondary | ICD-10-CM | POA: Diagnosis not present

## 2023-06-15 DIAGNOSIS — J439 Emphysema, unspecified: Secondary | ICD-10-CM | POA: Diagnosis not present

## 2023-06-15 DIAGNOSIS — F0390 Unspecified dementia without behavioral disturbance: Secondary | ICD-10-CM | POA: Diagnosis not present

## 2023-06-15 DIAGNOSIS — M810 Age-related osteoporosis without current pathological fracture: Secondary | ICD-10-CM | POA: Diagnosis not present

## 2023-06-21 ENCOUNTER — Ambulatory Visit

## 2023-06-23 ENCOUNTER — Encounter: Payer: Self-pay | Admitting: Family Medicine

## 2023-06-24 DIAGNOSIS — M4316 Spondylolisthesis, lumbar region: Secondary | ICD-10-CM | POA: Diagnosis not present

## 2023-06-24 DIAGNOSIS — M8588 Other specified disorders of bone density and structure, other site: Secondary | ICD-10-CM | POA: Diagnosis not present

## 2023-06-24 DIAGNOSIS — S22080G Wedge compression fracture of T11-T12 vertebra, subsequent encounter for fracture with delayed healing: Secondary | ICD-10-CM | POA: Diagnosis not present

## 2023-06-24 DIAGNOSIS — M545 Low back pain, unspecified: Secondary | ICD-10-CM | POA: Diagnosis not present

## 2023-06-24 DIAGNOSIS — M85861 Other specified disorders of bone density and structure, right lower leg: Secondary | ICD-10-CM | POA: Diagnosis not present

## 2023-06-24 DIAGNOSIS — S3993XA Unspecified injury of pelvis, initial encounter: Secondary | ICD-10-CM | POA: Diagnosis not present

## 2023-06-24 DIAGNOSIS — M85862 Other specified disorders of bone density and structure, left lower leg: Secondary | ICD-10-CM | POA: Diagnosis not present

## 2023-06-24 DIAGNOSIS — Z043 Encounter for examination and observation following other accident: Secondary | ICD-10-CM | POA: Diagnosis not present

## 2023-06-24 DIAGNOSIS — M79661 Pain in right lower leg: Secondary | ICD-10-CM | POA: Diagnosis not present

## 2023-06-24 DIAGNOSIS — I10 Essential (primary) hypertension: Secondary | ICD-10-CM | POA: Diagnosis not present

## 2023-06-24 DIAGNOSIS — R29890 Loss of height: Secondary | ICD-10-CM | POA: Diagnosis not present

## 2023-06-24 DIAGNOSIS — S22089G Unspecified fracture of T11-T12 vertebra, subsequent encounter for fracture with delayed healing: Secondary | ICD-10-CM | POA: Diagnosis not present

## 2023-06-24 DIAGNOSIS — W19XXXA Unspecified fall, initial encounter: Secondary | ICD-10-CM | POA: Diagnosis not present

## 2023-06-24 DIAGNOSIS — M51369 Other intervertebral disc degeneration, lumbar region without mention of lumbar back pain or lower extremity pain: Secondary | ICD-10-CM | POA: Diagnosis not present

## 2023-06-24 DIAGNOSIS — W19XXXD Unspecified fall, subsequent encounter: Secondary | ICD-10-CM | POA: Diagnosis not present

## 2023-06-24 DIAGNOSIS — M79662 Pain in left lower leg: Secondary | ICD-10-CM | POA: Diagnosis not present

## 2023-06-24 DIAGNOSIS — R531 Weakness: Secondary | ICD-10-CM | POA: Diagnosis not present

## 2023-07-05 DIAGNOSIS — S22080A Wedge compression fracture of T11-T12 vertebra, initial encounter for closed fracture: Secondary | ICD-10-CM | POA: Diagnosis not present

## 2023-07-05 DIAGNOSIS — M545 Low back pain, unspecified: Secondary | ICD-10-CM | POA: Diagnosis not present

## 2023-07-05 DIAGNOSIS — M25569 Pain in unspecified knee: Secondary | ICD-10-CM | POA: Diagnosis not present
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
## 2023-07-12 DIAGNOSIS — Z9181 History of falling: Secondary | ICD-10-CM | POA: Diagnosis not present

## 2023-07-12 DIAGNOSIS — R32 Unspecified urinary incontinence: Secondary | ICD-10-CM | POA: Diagnosis not present

## 2023-07-12 DIAGNOSIS — M8088XD Other osteoporosis with current pathological fracture, vertebra(e), subsequent encounter for fracture with routine healing: Secondary | ICD-10-CM | POA: Diagnosis not present

## 2023-07-16 DIAGNOSIS — T887XXA Unspecified adverse effect of drug or medicament, initial encounter: Secondary | ICD-10-CM | POA: Diagnosis not present

## 2023-07-16 DIAGNOSIS — Z1321 Encounter for screening for nutritional disorder: Secondary | ICD-10-CM | POA: Diagnosis not present

## 2023-07-16 DIAGNOSIS — T50901A Poisoning by unspecified drugs, medicaments and biological substances, accidental (unintentional), initial encounter: Secondary | ICD-10-CM | POA: Diagnosis not present

## 2023-07-16 DIAGNOSIS — Z888 Allergy status to other drugs, medicaments and biological substances status: Secondary | ICD-10-CM | POA: Diagnosis not present

## 2023-07-16 DIAGNOSIS — R000 Tachycardia, unspecified: Secondary | ICD-10-CM | POA: Diagnosis not present

## 2023-07-16 DIAGNOSIS — R0689 Other abnormalities of breathing: Secondary | ICD-10-CM | POA: Diagnosis not present

## 2023-07-16 DIAGNOSIS — F0390 Unspecified dementia without behavioral disturbance: Secondary | ICD-10-CM | POA: Diagnosis not present

## 2023-07-19 DIAGNOSIS — M8088XD Other osteoporosis with current pathological fracture, vertebra(e), subsequent encounter for fracture with routine healing: Secondary | ICD-10-CM | POA: Diagnosis not present

## 2023-07-19 DIAGNOSIS — Z9181 History of falling: Secondary | ICD-10-CM | POA: Diagnosis not present

## 2023-07-19 DIAGNOSIS — R32 Unspecified urinary incontinence: Secondary | ICD-10-CM | POA: Diagnosis not present

## 2023-07-20 ENCOUNTER — Telehealth: Payer: Self-pay
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
## 2023-07-20 NOTE — Telephone Encounter (Signed)
 An FYI of fall and requesting PT for pt. Left message for home health and pt as well to return call. Pt will need an appt to discuss if referral is being requested for PT.  Please schedule an appt in the office or at least a virtual to discuss.

## 2023-07-20 NOTE — Telephone Encounter (Signed)
 A message has been left for pt to return call and the need of scheduling an appt to discuss a PT order.

## 2023-07-24 ENCOUNTER — Telehealth: Payer: Self-pay | Admitting: Family Medicine

## 2023-07-24 NOTE — Telephone Encounter (Signed)
 Copied from CRM [id number]. Topic: General - Other >> Jul 20, 2023  4:03 PM Lotus Round B wrote: Reason for CRM: pt son called in because they received a message from [facility] about scheduling PT for the patient . He stated that medicare already has PT coming to the home and is paying 100% of it plus the patient is very comfortable with the therapist she has . So he wants to stick with them  .

## 2023-07-24 NOTE — Telephone Encounter (Signed)
Noted and encounter closed.

## 2023-07-25 DIAGNOSIS — M8088XD Other osteoporosis with current pathological fracture, vertebra(e), subsequent encounter for fracture with routine healing: Secondary | ICD-10-CM | POA: Diagnosis not present

## 2023-07-25 DIAGNOSIS — Z9181 History of falling: Secondary | ICD-10-CM | POA: Diagnosis not present

## 2023-07-25 DIAGNOSIS — R32 Unspecified urinary incontinence: Secondary | ICD-10-CM | POA: Diagnosis not present

## 2023-08-10 DIAGNOSIS — Z9181 History of falling: Secondary | ICD-10-CM | POA: Diagnosis not present

## 2023-08-10 DIAGNOSIS — M8088XD Other osteoporosis with current pathological fracture, vertebra(e), subsequent encounter for fracture with routine healing: Secondary | ICD-10-CM | POA: Diagnosis not present

## 2023-08-10 DIAGNOSIS — R32 Unspecified urinary incontinence: Secondary | ICD-10-CM | POA: Diagnosis not present

## 2023-08-11 DIAGNOSIS — Z9181 History of falling: Secondary | ICD-10-CM | POA: Diagnosis not present

## 2023-08-11 DIAGNOSIS — R32 Unspecified urinary incontinence: Secondary | ICD-10-CM | POA: Diagnosis not present

## 2023-08-11 DIAGNOSIS — M8088XD Other osteoporosis with current pathological fracture, vertebra(e), subsequent encounter for fracture with routine healing: Secondary | ICD-10-CM | POA: Diagnosis not present

## 2023-08-22 DIAGNOSIS — S22080A Wedge compression fracture of T11-T12 vertebra, initial encounter for closed fracture: Secondary | ICD-10-CM | POA: Diagnosis not present

## 2023-08-22 DIAGNOSIS — M545 Low back pain, unspecified: Secondary | ICD-10-CM | POA: Diagnosis not present

## 2023-08-23 DIAGNOSIS — Z9181 History of falling: Secondary | ICD-10-CM | POA: Diagnosis not present

## 2023-08-23 DIAGNOSIS — R32 Unspecified urinary incontinence: Secondary | ICD-10-CM | POA: Diagnosis not present

## 2023-08-23 DIAGNOSIS — M8088XD Other osteoporosis with current pathological fracture, vertebra(e), subsequent encounter for fracture with routine healing: Secondary | ICD-10-CM | POA: Diagnosis not present

## 2023-09-01 DIAGNOSIS — R32 Unspecified urinary incontinence: Secondary | ICD-10-CM | POA: Diagnosis not present

## 2023-09-01 DIAGNOSIS — Z9181 History of falling: Secondary | ICD-10-CM | POA: Diagnosis not present

## 2023-09-01 DIAGNOSIS — M8088XD Other osteoporosis with current pathological fracture, vertebra(e), subsequent encounter for fracture with routine healing: Secondary | ICD-10-CM | POA: Diagnosis not present

## 2023-11-16 ENCOUNTER — Ambulatory Visit: Admitting: Family Medicine

## 2023-11-17 ENCOUNTER — Encounter: Payer: Self-pay | Admitting: Family Medicine

## 2024-03-14 ENCOUNTER — Emergency Department (HOSPITAL_COMMUNITY)

## 2024-03-14 ENCOUNTER — Emergency Department (HOSPITAL_COMMUNITY)
Admission: EM | Admit: 2024-03-14 | Discharge: 2024-03-14 | Disposition: A | Discharge status: Home / Self Care | Attending: Emergency Medicine | Admitting: Emergency Medicine

## 2024-03-14 ENCOUNTER — Other Ambulatory Visit: Payer: Self-pay

## 2024-03-14 ENCOUNTER — Encounter (HOSPITAL_COMMUNITY): Payer: Self-pay

## 2024-03-14 DIAGNOSIS — S3992XA Unspecified injury of lower back, initial encounter: Secondary | ICD-10-CM | POA: Diagnosis not present

## 2024-03-14 DIAGNOSIS — W1809XA Striking against other object with subsequent fall, initial encounter: Secondary | ICD-10-CM | POA: Diagnosis not present

## 2024-03-14 DIAGNOSIS — F0390 Unspecified dementia without behavioral disturbance: Secondary | ICD-10-CM | POA: Insufficient documentation

## 2024-03-14 DIAGNOSIS — R9431 Abnormal electrocardiogram [ECG] [EKG]: Secondary | ICD-10-CM | POA: Diagnosis not present

## 2024-03-14 DIAGNOSIS — S300XXA Contusion of lower back and pelvis, initial encounter: Secondary | ICD-10-CM | POA: Diagnosis not present

## 2024-03-14 DIAGNOSIS — S299XXA Unspecified injury of thorax, initial encounter: Secondary | ICD-10-CM | POA: Diagnosis not present

## 2024-03-14 DIAGNOSIS — M8448XA Pathological fracture, other site, initial encounter for fracture: Secondary | ICD-10-CM | POA: Diagnosis not present

## 2024-03-14 DIAGNOSIS — M47814 Spondylosis without myelopathy or radiculopathy, thoracic region: Secondary | ICD-10-CM | POA: Diagnosis not present

## 2024-03-14 DIAGNOSIS — S20229A Contusion of unspecified back wall of thorax, initial encounter: Secondary | ICD-10-CM

## 2024-03-14 LAB — COMPREHENSIVE METABOLIC PANEL WITH GFR
ALT: 11 U/L (ref 0–44)
AST: 17 U/L (ref 15–41)
Albumin: 4.2 g/dL (ref 3.5–5.0)
Alkaline Phosphatase: 85 U/L (ref 38–126)
Anion gap: 12 (ref 5–15)
BUN: 19 mg/dL (ref 8–23)
CO2: 25 mmol/L (ref 22–32)
Calcium: 9.9 mg/dL (ref 8.9–10.3)
Chloride: 99 mmol/L (ref 98–111)
Creatinine, Ser: 0.68 mg/dL (ref 0.44–1.00)
GFR, Estimated: >60 mL/min (ref >60)
Glucose, Bld: 105 mg/dL — ABNORMAL HIGH (ref 70–99)
Potassium: 4.4 mmol/L (ref 3.5–5.1)
Sodium: 135 mmol/L (ref 135–145)
Total Bilirubin: 0.3 mg/dL (ref 0.0–1.2)
Total Protein: 6.8 g/dL (ref 6.5–8.1)

## 2024-03-14 LAB — CBC WITH DIFFERENTIAL/PLATELET
Abs Immature Granulocytes: 0.03 K/uL (ref 0.00–0.07)
Basophils Absolute: 0 K/uL (ref 0.0–0.1)
Basophils Relative: 0 %
Eosinophils Absolute: 0 K/uL (ref 0.0–0.5)
Eosinophils Relative: 0 %
HCT: 40.7 % (ref 36.0–46.0)
Hemoglobin: 13.5 g/dL (ref 12.0–15.0)
Immature Granulocytes: 0 %
Lymphocytes Relative: 13 %
Lymphs Abs: 1.1 K/uL (ref 0.7–4.0)
MCH: 31.1 pg (ref 26.0–34.0)
MCHC: 33.2 g/dL (ref 30.0–36.0)
MCV: 93.8 fL (ref 80.0–100.0)
Monocytes Absolute: 0.5 K/uL (ref 0.1–1.0)
Monocytes Relative: 6 %
Neutro Abs: 6.7 K/uL (ref 1.7–7.7)
Neutrophils Relative %: 81 %
Platelets: 340 K/uL (ref 150–400)
RBC: 4.34 MIL/uL (ref 3.87–5.11)
RDW: 12.7 % (ref 11.5–15.5)
WBC: 8.4 K/uL (ref 4.0–10.5)
nRBC: 0 % (ref 0.0–0.2)

## 2024-03-14 MED ORDER — NAPROXEN 250 MG PO TABS: 500.0000 mg | ORAL_TABLET | Freq: Once | ORAL | Status: DC

## 2024-03-14 MED ORDER — NAPROXEN 500 MG PO TABS: 500.0000 mg | ORAL_TABLET | Freq: Two times a day (BID) | ORAL | 0 refills | Status: DC
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
## 2024-03-14 NOTE — ED Provider Notes (Addendum)
 [place] [facility] DEPARTMENT AT [facility] Provider Note   CSN: [id number] Arrival date & time: 03/14/24  1310     Patient presents with: Lauren Bruce   Lauren Bruce is a 89 y.o. female.   HPI   This patient is an 89 year old female, she presents to the hospital in the care of paramedic transport after she was witnessed to have fallen backwards and striking a chair with low back, the impact of the chair hit her mid back, son caught her and carried her to the ground lowering her so that she did not hit anything on the ground.  Reportedly there was no loss of consciousness, the pain has been present ever since this happened within the last couple of hours.  No head injury no vomiting no seizures no shortness of breath no numbness no weakness.  Paramedics report normal vital signs prehospital  Further information was obtained from the patient's son who eventually called, he was difficult to reach by phone but family called.,  He reports that she did stumble backwards, after she struck the chair and he helped lowered to the ground she had a period of 2 to 3 minutes where she had decreased level of consciousness, there was no seizure activity seen, there was no vomiting she did not become sweaty or pale, she had some erratic breathing which eventually corrected.  She reports that she has not been to the doctor very often, who lives with her and has a primary caregiver.  Prior to Admission medications  Medication Sig Start Date End Date Taking? Authorizing Provider  naproxen (NAPROSYN) 500 MG tablet Take 1 tablet (500 mg total) by mouth 2 (two) times daily. 03/14/24  Yes Cleotilde Rogue, MD  acetaminophen  (TYLENOL ) 650 MG CR tablet Take 650 mg by mouth every 6 (six) hours as needed for pain.    [provider]  augmented betamethasone  dipropionate (DIPROLENE -AF) 0.05 % cream Apply topically 2 (two) times daily. 12/01/22   Lavell Bari LABOR, FNP  cephALEXin  (KEFLEX ) 500 MG capsule  Take 1 capsule (500 mg total) by mouth 3 (three) times daily. 12/01/22   Lavell Bari LABOR, FNP  docusate sodium  (COLACE) 100 MG capsule Take 1 capsule (100 mg total) by mouth 2 (two) times daily. 09/14/21   Johnson, Clanford L, MD  doxycycline  (VIBRA -TABS) 100 MG tablet Take 1 tablet (100 mg total) by mouth 2 (two) times daily. 12/08/22   Lavell Bari A, FNP  Melatonin 10 MG TABS Take 20 mg by mouth at bedtime.    [provider]  nicotine (NICODERM CQ - DOSED IN MG/24 HOURS) 21 mg/24hr patch Place 21 mg onto the skin daily.  Remove old patch prior to application of new patch - rotate sites Patient not taking: Reported on 12/08/2022    [provider]  NON FORMULARY Diet: Regular    [provider]    Allergies: Demerol [meperidine] and Xylocaine  [lidocaine ]    Review of Systems  All other systems reviewed and are negative.   Updated Vital Signs BP 114/63   Pulse 66   Temp 98.2 F (36.8 C) (Oral)   Wt 52.6 kg   LMP 10/04/2011   SpO2 95%   BMI 21.21 kg/m   Physical Exam Vitals and nursing note reviewed.  Constitutional:      General: She is not in acute distress.    Appearance: She is well-developed.  HENT:     Head: Normocephalic and atraumatic.     Mouth/Throat:  Pharynx: No oropharyngeal exudate.  Eyes:     General: No scleral icterus.       Right eye: No discharge.        Left eye: No discharge.     Conjunctiva/sclera: Conjunctivae normal.     Pupils: Pupils are equal, round, and reactive to light.  Neck:     Thyroid : No thyromegaly.     Vascular: No JVD.  Cardiovascular:     Rate and Rhythm: Normal rate and regular rhythm.     Heart sounds: Normal heart sounds. No murmur heard.    No friction rub. No gallop.  Pulmonary:     Effort: Pulmonary effort is normal. No respiratory distress.     Breath sounds: Normal breath sounds. No wheezing or rales.  Abdominal:     General: Bowel sounds are normal. There is no distension.     Palpations:  Abdomen is soft. There is no mass.     Tenderness: There is no abdominal tenderness.  Musculoskeletal:        General: Tenderness present. Normal range of motion.     Cervical back: Normal range of motion and neck supple.     Comments: Tenderness is present only in the mid thoracic back over the spinal processes, minimal paraspinal tenderness, there is no bruising or tenderness over the ribs  Lymphadenopathy:     Cervical: No cervical adenopathy.  Skin:    General: Skin is warm and dry.     Findings: No erythema or rash.  Neurological:     Mental Status: She is alert.     Coordination: Coordination normal.     Comments: The patient is able to fully range all 4 extremities with normal strength and sensation.  She can straight leg raise bilaterally without any difficulty whatsoever.  Totally normal strength in the legs.  Psychiatric:        Behavior: Behavior normal.     (all labs ordered are listed, but only abnormal results are displayed) Labs Reviewed - No data to display  EKG: None  Radiology: DG Thoracic Spine 2 View Result Date: 03/14/2024 EXAM: XR Thoracic spine, 2 Views total. CLINICAL HISTORY: trauma, fall, impact mid back COMPARISON: 08/11/2021 FINDINGS: BONES: Severe compression deformity of what appears to be T11 vertebral body which appears to be chronic fracture. Posterior vertebral body alignment is within normal limits in the thoracic spine. DISCS/DEGENERATIVE CHANGES: The disc spaces are preserved. SOFT TISSUES: No prevertebral soft tissue swelling evident in the thoracic spine. The visualized lungs appear clear. IMPRESSION: 1. Severe compression deformity of the T11 vertebral body, favored to represent a chronic fracture. Electronically signed by: Lynwood Seip MD 03/14/2024 02:39 PM EST RP Workstation: [id number]     Procedures   Medications Ordered in the ED  naproxen (NAPROSYN) tablet 500 mg (500 mg Oral Patient Refused/Not Given 03/14/24 1347)                                     Medical Decision Making Amount and/or Complexity of Data Reviewed Labs: ordered. Radiology: ordered. ECG/medicine tests: ordered.  Risk Prescription drug management.    lalThis patient presents to the ED for concern of  trauma and injury to the back differential diagnosis includes possible fracture, possible contusion, will need additional information from the family to make sure that this was not a syncopal event leading to this, the patient evidently has some advanced dementia and does  not have memory of the events and cannot help with history.    Additional history obtained   Additional history obtained from Electronic Medical Record External records from outside source obtained and reviewed including multiple visits to emergency departments, office visit for low back pain in May 2025   Imaging Studies ordered:  I ordered imaging studies including thoracic imaging, plain film I independently visualized and interpreted imaging which showed a chronic compression fracture of the mid T-spine I agree with the radiologist interpretation   Medicines ordered and prescription drug management:  I ordered medication including Naprosyn    I have reviewed the patients home medicines and have made adjustments as needed   Problem List / ED Course:  Contusion of back, the patient appears to have no neurologic dysfunction whatsoever, she is moving both arms both legs freely without discomfort numbness weakness or abnormal coordination.   Social Determinants of Health:  Dementia  I attempted to call the son, there is no answer at the phone number on the demographic sheet.  There is no family members here at the time, I will have the nurse continue to reach out to get the patient transported home, we will give a short prescription of anti-inflammatories  After the additional information was obtained from the send an EKG was ordered obtained and interpreted, there is  no findings of any concern including interval abnormalities arrhythmias or ischemia.  Lab work is pending, if labs are unremarkable the patient will be discharged back home  Change of shift - care signed out to Dr. Remonia    Final diagnoses:  Contusion of back, unspecified laterality, initial encounter    ED Discharge Orders          Ordered    naproxen (NAPROSYN) 500 MG tablet  2 times daily        03/14/24 1507               Cleotilde Rogue, MD 03/14/24 AMADO    Cleotilde Rogue, MD 03/14/24 1526

## 2024-03-14 NOTE — ED Provider Notes (Signed)
 I took over care of this patient at 3:30 PM pending lab work.  She appears well. Physical Exam  BP 136/72   Pulse 65   Temp 98.2 F (36.8 C) (Oral)   Wt 52.6 kg   LMP 10/04/2011   SpO2 96%   BMI 21.21 kg/m    ED Course / MDM    Medical Decision Making Remainder of lab workup negative.  Patient will be discharged.  Son will come to pick her up.  Problems Addressed: Contusion of back, unspecified laterality, initial encounter: acute illness or injury  Risk OTC drugs. Prescription drug management. Diagnosis or treatment significantly limited by social determinants of health.          Gennaro Duwaine CROME, DO 03/14/24 2242

## 2024-04-07 ENCOUNTER — Encounter (HOSPITAL_COMMUNITY): Payer: Self-pay | Admitting: Emergency Medicine

## 2024-04-07 ENCOUNTER — Other Ambulatory Visit: Payer: Self-pay

## 2024-04-07 ENCOUNTER — Emergency Department (HOSPITAL_COMMUNITY)

## 2024-04-07 ENCOUNTER — Observation Stay (HOSPITAL_COMMUNITY)
Admission: EM | Admit: 2024-04-07 | Discharge: 2024-04-09 | Disposition: A | Attending: Family Medicine | Admitting: Family Medicine

## 2024-04-07 DIAGNOSIS — Z87891 Personal history of nicotine dependence: Secondary | ICD-10-CM | POA: Insufficient documentation

## 2024-04-07 DIAGNOSIS — R2681 Unsteadiness on feet: Secondary | ICD-10-CM | POA: Diagnosis not present

## 2024-04-07 DIAGNOSIS — N3000 Acute cystitis without hematuria: Secondary | ICD-10-CM | POA: Diagnosis not present

## 2024-04-07 DIAGNOSIS — M6281 Muscle weakness (generalized): Secondary | ICD-10-CM | POA: Diagnosis not present

## 2024-04-07 DIAGNOSIS — G9341 Metabolic encephalopathy: Secondary | ICD-10-CM | POA: Diagnosis not present

## 2024-04-07 DIAGNOSIS — N390 Urinary tract infection, site not specified: Principal | ICD-10-CM | POA: Diagnosis present

## 2024-04-07 DIAGNOSIS — R2689 Other abnormalities of gait and mobility: Secondary | ICD-10-CM | POA: Diagnosis not present

## 2024-04-07 DIAGNOSIS — Z85828 Personal history of other malignant neoplasm of skin: Secondary | ICD-10-CM | POA: Insufficient documentation

## 2024-04-07 DIAGNOSIS — R531 Weakness: Secondary | ICD-10-CM | POA: Diagnosis present

## 2024-04-07 LAB — COMPREHENSIVE METABOLIC PANEL WITH GFR
ALT: 11 U/L (ref 0–44)
AST: 17 U/L (ref 15–41)
Albumin: 3.9 g/dL (ref 3.5–5.0)
Alkaline Phosphatase: 76 U/L (ref 38–126)
Anion gap: 6 (ref 5–15)
BUN: 19 mg/dL (ref 8–23)
CO2: 29 mmol/L (ref 22–32)
Calcium: 9.5 mg/dL (ref 8.9–10.3)
Chloride: 100 mmol/L (ref 98–111)
Creatinine, Ser: 0.75 mg/dL (ref 0.44–1.00)
GFR, Estimated: >60 mL/min
Glucose, Bld: 104 mg/dL — ABNORMAL HIGH (ref 70–99)
Potassium: 4.1 mmol/L (ref 3.5–5.1)
Sodium: 134 mmol/L — ABNORMAL LOW (ref 135–145)
Total Bilirubin: 0.3 mg/dL (ref 0.0–1.2)
Total Protein: 6.2 g/dL — ABNORMAL LOW (ref 6.5–8.1)

## 2024-04-07 LAB — URINALYSIS, W/ REFLEX TO CULTURE (INFECTION SUSPECTED)
Bilirubin Urine: NEGATIVE
Glucose, UA: NEGATIVE mg/dL
Hgb urine dipstick: NEGATIVE
Ketones, ur: NEGATIVE mg/dL
Nitrite: POSITIVE — AB
Protein, ur: NEGATIVE mg/dL
Specific Gravity, Urine: 1.019 (ref 1.005–1.030)
pH: 5 (ref 5.0–8.0)

## 2024-04-07 LAB — CBC WITH DIFFERENTIAL/PLATELET
Abs Immature Granulocytes: 0.07 K/uL (ref 0.00–0.07)
Basophils Absolute: 0 K/uL (ref 0.0–0.1)
Basophils Relative: 0 %
Eosinophils Absolute: 0 K/uL (ref 0.0–0.5)
Eosinophils Relative: 0 %
HCT: 40.5 % (ref 36.0–46.0)
Hemoglobin: 13.4 g/dL (ref 12.0–15.0)
Immature Granulocytes: 1 %
Lymphocytes Relative: 8 %
Lymphs Abs: 1.1 K/uL (ref 0.7–4.0)
MCH: 30.8 pg (ref 26.0–34.0)
MCHC: 33.1 g/dL (ref 30.0–36.0)
MCV: 93.1 fL (ref 80.0–100.0)
Monocytes Absolute: 0.6 K/uL (ref 0.1–1.0)
Monocytes Relative: 4 %
Neutro Abs: 11 K/uL — ABNORMAL HIGH (ref 1.7–7.7)
Neutrophils Relative %: 87 %
Platelets: 298 K/uL (ref 150–400)
RBC: 4.35 MIL/uL (ref 3.87–5.11)
RDW: 12.2 % (ref 11.5–15.5)
WBC: 12.7 K/uL — ABNORMAL HIGH (ref 4.0–10.5)
nRBC: 0 % (ref 0.0–0.2)

## 2024-04-07 MED ORDER — POLYETHYLENE GLYCOL 3350 17 G PO PACK: 17.0000 g | PACK | Freq: Every day | ORAL | Status: DC

## 2024-04-07 MED ORDER — MELATONIN 3 MG PO TABS: 6.0000 mg | ORAL_TABLET | Freq: Every evening | ORAL | Status: DC | PRN

## 2024-04-07 MED ORDER — SODIUM CHLORIDE 0.9 % IV SOLN: INTRAVENOUS | Status: AC

## 2024-04-07 MED ORDER — ENOXAPARIN SODIUM 40 MG/0.4ML IJ SOSY
40.0000 mg | PREFILLED_SYRINGE | INTRAMUSCULAR | Status: DC
  Administered 2024-04-08: 40 mg via SUBCUTANEOUS
  Filled 2024-04-07 (×2): qty 0.4

## 2024-04-07 MED ORDER — POLYETHYLENE GLYCOL 3350 17 G PO PACK: 17.0000 g | PACK | Freq: Every day | ORAL | Status: DC | PRN

## 2024-04-07 MED ORDER — PROCHLORPERAZINE EDISYLATE 10 MG/2ML IJ SOLN: 5.0000 mg | Freq: Four times a day (QID) | INTRAMUSCULAR | Status: DC | PRN

## 2024-04-07 MED ORDER — SODIUM CHLORIDE 0.9 % IV SOLN
1.0000 g | Freq: Once | INTRAVENOUS | Status: AC
  Administered 2024-04-07: 1 g via INTRAVENOUS
  Filled 2024-04-07: qty 10

## 2024-04-07 MED ORDER — ACETAMINOPHEN 500 MG PO TABS: 500.0000 mg | ORAL_TABLET | Freq: Four times a day (QID) | ORAL | Status: DC | PRN

## 2024-04-07 NOTE — H&P (Signed)
 " History and Physical  Lauren Bruce FMW:[id number] DOB: 09/03/1933 DOA: 04/07/2024  Referring physician: Dr. Francesca, EDP  PCP: Dettinger, Fonda LABOR, MD  Outpatient Specialists: Orthopedic surgery. Patient coming from: Home.  Chief Complaint: Generalized weakness and confusion.  HPI: Lauren Bruce is a 89 y.o. female with medical history significant for falls which resulted in vertebral compression fracture T11 and right hip fracture in 2022, sent to the ER due to progressive generalized weakness, multiple falls today at home, and confusion.  She is alert and oriented to self and unable to provide a history.  In the ER, afebrile, vital signs are stable.  Lab studies notable for leukocytosis 12.7 K and mild hyponatremia 134.  UA is positive for pyuria and nitrite positive.  Urine culture was obtained and is pending.  The patient received IV Rocephin  in the ER.  TRH, hospitalist service, was asked to admit.  ED Course: Temperature 97.9.  BP 161/79, pulse 80, respiratory 16, O2 saturation 100% on room air.  Review of Systems: Review of systems as noted in the HPI. All other systems reviewed and are negative.   Past Medical History:  Diagnosis Date   Anginal pain    Arthritis    Cancer (HCC)    skin cancer on her left eye    Mitral valve prolapse    PONV (postoperative nausea and vomiting)    Past Surgical History:  Procedure Laterality Date   ABDOMINAL HYSTERECTOMY     ANTERIOR AND POSTERIOR REPAIR  10/11/2011   Procedure: ANTERIOR (CYSTOCELE) AND POSTERIOR REPAIR (RECTOCELE);  Surgeon: Norleen LULLA Server, MD;  Location: [facility];  Service: Gynecology;  Laterality: N/A;   BREAST SURGERY     benign, right, [place], [place]   CHOLECYSTECTOMY     [place], [place]   COMPRESSION HIP SCREW Right 09/10/2021   Procedure: Open Treatment Internal Fixation of Right Hip;  Surgeon: Margrette Taft BRAVO, MD;  Location: [facility];  Service: Orthopedics;  Laterality: Right;   SALPINGOOPHORECTOMY  10/11/2011    Procedure: SALPINGO OOPHERECTOMY;  Surgeon: Norleen LULLA Server, MD;  Location: [facility];  Service: Gynecology;  Laterality: Right;   TONSILLECTOMY     VAGINAL HYSTERECTOMY  10/11/2011   Procedure: HYSTERECTOMY VAGINAL;  Surgeon: Norleen LULLA Server, MD;  Location: [facility];  Service: Gynecology;  Laterality: N/A;    Social History:  reports that she has quit smoking. She has never used smokeless tobacco. She reports that she does not drink alcohol  and does not use drugs.   Allergies[1]  Family History  Problem Relation Age of Onset   Heart disease Sister    Diabetes Brother    Heart disease Brother    Cancer Daughter        throat    Pneumonia Daughter    Heart attack Son    GI problems Son       Prior to Admission medications  Medication Sig Start Date End Date Taking? Authorizing Provider  acetaminophen  (TYLENOL ) 650 MG CR tablet Take 650 mg by mouth every 6 (six) hours as needed for pain.    [provider]  augmented betamethasone  dipropionate (DIPROLENE -AF) 0.05 % cream Apply topically 2 (two) times daily. 12/01/22   Lavell Bari LABOR, FNP  cephALEXin  (KEFLEX ) 500 MG capsule Take 1 capsule (500 mg total) by mouth 3 (three) times daily. 12/01/22   Lavell Bari LABOR, FNP  docusate sodium  (COLACE) 100 MG capsule Take 1 capsule (100 mg total) by mouth 2 (two) times daily. 09/14/21  Johnson, Clanford L, MD  doxycycline  (VIBRA -TABS) 100 MG tablet Take 1 tablet (100 mg total) by mouth 2 (two) times daily. 12/08/22   Lavell Lye A, FNP  Melatonin 10 MG TABS Take 20 mg by mouth at bedtime.    [provider]  naproxen  (NAPROSYN ) 500 MG tablet Take 1 tablet (500 mg total) by mouth 2 (two) times daily. 03/14/24   Cleotilde Rogue, MD  nicotine (NICODERM CQ - DOSED IN MG/24 HOURS) 21 mg/24hr patch Place 21 mg onto the skin daily.  Remove old patch prior to application of new patch - rotate sites Patient not taking: Reported on 12/08/2022    [provider]  NON FORMULARY Diet:  Regular    [provider]    Physical Exam: BP 119/72   Pulse 78   Temp 98.1 F (36.7 C) (Oral)   Resp 15   Ht 5' 2 (1.575 m)   Wt 52.6 kg   LMP 10/04/2011   SpO2 94%   BMI 21.21 kg/m   General: 89 y.o. year-old female well developed well nourished in no acute distress.  Alert and oriented x1. Cardiovascular: Regular rate and rhythm with no rubs or gallops.  No thyromegaly or JVD noted.  No lower extremity edema. 2/4 pulses in all 4 extremities. Respiratory: Clear to auscultation with no wheezes or rales. Good inspiratory effort. Abdomen: Soft nontender nondistended with normal bowel sounds x4 quadrants. Muskuloskeletal: No cyanosis, clubbing or edema noted bilaterally Neuro: CN II-XII intact, strength, sensation, reflexes Skin: No ulcerative lesions noted or rashes Psychiatry: Judgement and insight appear altered. Mood is appropriate for condition and setting          Labs on Admission:  Basic Metabolic Panel: Recent Labs  Lab 04/07/24 2049  NA 134*  K 4.1  CL 100  CO2 29  GLUCOSE 104*  BUN 19  CREATININE 0.75  CALCIUM 9.5   Liver Function Tests: Recent Labs  Lab 04/07/24 2049  AST 17  ALT 11  ALKPHOS 76  BILITOT 0.3  PROT 6.2*  ALBUMIN 3.9   No results for input(s): LIPASE, AMYLASE in the last 168 hours. No results for input(s): AMMONIA in the last 168 hours. CBC: Recent Labs  Lab 04/07/24 2049  WBC 12.7*  NEUTROABS 11.0*  HGB 13.4  HCT 40.5  MCV 93.1  PLT 298   Cardiac Enzymes: No results for input(s): CKTOTAL, CKMB, CKMBINDEX, TROPONINI in the last 168 hours.  BNP (last 3 results) No results for input(s): BNP in the last 8760 hours.  ProBNP (last 3 results) No results for input(s): PROBNP in the last 8760 hours.  CBG: No results for input(s): GLUCAP in the last 168 hours.  Radiological Exams on Admission: CT Head Wo Contrast Result Date: 04/07/2024 EXAM: CT HEAD AND CERVICAL SPINE 04/07/2024 08:35:00 PM  TECHNIQUE: CT of the head and cervical spine was performed without the administration of intravenous contrast. Multiplanar reformatted images are provided for review. Automated exposure control, iterative reconstruction, and/or weight based adjustment of the mA/kV was utilized to reduce the radiation dose to as low as reasonably achievable. COMPARISON: Cervical spine CT and head CT, both 09/08/2021. CLINICAL HISTORY: Head trauma, minor (Age >= 65y). The patient had multiple falls at home today. FINDINGS: CT HEAD BRAIN AND VENTRICLES: No acute intracranial hemorrhage. No mass effect or midline shift. No abnormal extra-axial fluid collection. No evidence of acute infarct. No hydrocephalus. There is mild global atrophy and mild small vessel disease of the cerebral white matter for  age. Scattered calcifications in the siphons. No hyperdense vessel is seen. ORBITS: No acute abnormality. SINUSES AND MASTOIDS: No acute abnormality. SOFT TISSUES AND SKULL: No acute soft tissue abnormality. There are wax impactions in both external auditory canals. No depressed skull fracture or scalp hematoma is seen. CT CERVICAL SPINE BONES AND ALIGNMENT: No acute fracture or traumatic malalignment. There is minimal chronic degenerative anterolisthesis again at C7-T1 and T1-T2, but there is no traumatic or further listhesis. Bone on bone anterior atlanto-dental joint space loss and osteophytes are again shown. Previously noted fractures of the posterior C2 body and right lateral mass, and left C2 pedicle and facet have healed in the interval. There is osteopenia without evidence of acute fractures or focal pathologic process. DEGENERATIVE CHANGES: The discs are normal in height at C2-C3, C4-C5, and C7-T1, moderately degenerated at remaining levels with bidirectional endplate osteophytes at C5-C6 and C6-C7 encroaching on the thecal sac but no more than previously. There are facet joint and uncinate spurs with C2-C3 mild right foraminal  stenosis, with mild to moderate foraminal stenosis bilaterally at C5-C6. SOFT TISSUES: No prevertebral soft tissue swelling. There are multiple tiny stones in the right parotid gland. There is no laryngeal mass. There is a heterogeneous 2.4 cm mass of the lower pole of the left lobe of the thyroid  and a densely calcified 1.5 cm left thyroid  nodule just above this. Both are similar to chest CT 08/11/2021. No follow-up imaging recommended considering advanced age. No acute upper thoracic findings. IMPRESSION: 1. No acute intracranial CT findings or depressed skull fractures. 2. No acute fracture or traumatic malalignment of the cervical spine. 3. Osteopenia and degenerative change. 4. . Atrophy and small vessel changes in the brain. Electronically signed by: Francis Quam MD 04/07/2024 09:06 PM EST RP Workstation: [id number]   CT Cervical Spine Wo Contrast Result Date: 04/07/2024 EXAM: CT HEAD AND CERVICAL SPINE 04/07/2024 08:35:00 PM TECHNIQUE: CT of the head and cervical spine was performed without the administration of intravenous contrast. Multiplanar reformatted images are provided for review. Automated exposure control, iterative reconstruction, and/or weight based adjustment of the mA/kV was utilized to reduce the radiation dose to as low as reasonably achievable. COMPARISON: Cervical spine CT and head CT, both 09/08/2021. CLINICAL HISTORY: Head trauma, minor (Age >= 65y). The patient had multiple falls at home today. FINDINGS: CT HEAD BRAIN AND VENTRICLES: No acute intracranial hemorrhage. No mass effect or midline shift. No abnormal extra-axial fluid collection. No evidence of acute infarct. No hydrocephalus. There is mild global atrophy and mild small vessel disease of the cerebral white matter for age. Scattered calcifications in the siphons. No hyperdense vessel is seen. ORBITS: No acute abnormality. SINUSES AND MASTOIDS: No acute abnormality. SOFT TISSUES AND SKULL: No acute soft tissue abnormality.  There are wax impactions in both external auditory canals. No depressed skull fracture or scalp hematoma is seen. CT CERVICAL SPINE BONES AND ALIGNMENT: No acute fracture or traumatic malalignment. There is minimal chronic degenerative anterolisthesis again at C7-T1 and T1-T2, but there is no traumatic or further listhesis. Bone on bone anterior atlanto-dental joint space loss and osteophytes are again shown. Previously noted fractures of the posterior C2 body and right lateral mass, and left C2 pedicle and facet have healed in the interval. There is osteopenia without evidence of acute fractures or focal pathologic process. DEGENERATIVE CHANGES: The discs are normal in height at C2-C3, C4-C5, and C7-T1, moderately degenerated at remaining levels with bidirectional endplate osteophytes at C5-C6 and C6-C7 encroaching on the  thecal sac but no more than previously. There are facet joint and uncinate spurs with C2-C3 mild right foraminal stenosis, with mild to moderate foraminal stenosis bilaterally at C5-C6. SOFT TISSUES: No prevertebral soft tissue swelling. There are multiple tiny stones in the right parotid gland. There is no laryngeal mass. There is a heterogeneous 2.4 cm mass of the lower pole of the left lobe of the thyroid  and a densely calcified 1.5 cm left thyroid  nodule just above this. Both are similar to chest CT 08/11/2021. No follow-up imaging recommended considering advanced age. No acute upper thoracic findings. IMPRESSION: 1. No acute intracranial CT findings or depressed skull fractures. 2. No acute fracture or traumatic malalignment of the cervical spine. 3. Osteopenia and degenerative change. 4. . Atrophy and small vessel changes in the brain. Electronically signed by: Francis Quam MD 04/07/2024 09:06 PM EST RP Workstation: [id number]    EKG: I independently viewed the EKG done and my findings are as followed: None available at the time of this visit.  Assessment/Plan Present on Admission:   UTI (urinary tract infection)  Principal Problem:   UTI (urinary tract infection)  Presumptive UTI, POA UA positive for pyuria with nitrite positive Follow urine culture for ID and sensitivities Continue IV Rocephin  2 g daily, narrow down antibiotics when able. Gentle IV fluid hydration NS at 75 cc/h Monitor fever curve and WBCs.  Acute metabolic encephalopathy suspect secondary to the above Continue to treat underlying condition Reorient as needed Fall precautions.  Generalized weakness Continue to treat underlying conditions PT OT evaluation Fall precautions.   Time: 75 minutes.   DVT prophylaxis: Subcu Lovenox  daily.  Code Status: DNR/DNI.  Family Communication: None at bedside.  Disposition Plan: Admitted to MedSurg unit.  Consults called: None.  Admission status: Observation status.   Status is: Observation    Terry LOISE Hurst MD [facility] Pager [phone]  If 7PM-7AM, please contact night-coverage [email or web address] Password [email or web address]  04/07/2024, 11:14 PM      [1]  Allergies Allergen Reactions   Demerol [Meperidine] Shortness Of Breath   Xylocaine  [Lidocaine ] Shortness Of Breath    Occurred with dental procedure using Lido and Epinephrine. Tolerated Marcaine  at [facility] without problems   "

## 2024-04-07 NOTE — ED Triage Notes (Signed)
 Per family pt has has multiple falls today at home. Son was concerned about the last fall she had urinary incontinence.

## 2024-04-07 NOTE — ED Provider Notes (Signed)
 " [facility] MEDICAL SURGICAL UNIT Provider Note  CSN: [id number] Arrival date & time: 04/07/24 1821  Chief Complaint(s) Fall  HPI Lauren Bruce is a 89 y.o. female history of dementia, GERD presenting to the emergency department with behavior change.  Patient really not able to tell me why she is here, she reports that she feels okay.  Per paramedic she has had increased falls and fell today at home.  Unclear if she injured herself in any way but she denies any pain or discomfort.  Apparently has also been behaving strangely such as trying to urinate in a dog bowl.  History is limited due to dementia.   Past Medical History Past Medical History:  Diagnosis Date   Anginal pain    Arthritis    Cancer (HCC)    skin cancer on her left eye    Mitral valve prolapse    PONV (postoperative nausea and vomiting)    Patient Active Problem List   Diagnosis Date Noted   UTI (urinary tract infection) 04/07/2024   Neurodegenerative cognitive impairment 09/15/2021   Moderate protein malnutrition 09/15/2021   Aortic atherosclerosis 09/15/2021   Acute blood loss anemia 09/15/2021   Chronic constipation 09/15/2021   Sundowning 09/10/2021   C2 cervical fracture (HCC) 09/10/2021   DNR (do not resuscitate) 09/09/2021   Closed fracture of right hip (HCC) 09/08/2021   GERD (gastroesophageal reflux disease) 09/08/2021   Acute respiratory failure with hypoxia (HCC) 09/08/2021   Insomnia 02/12/2019   Fall 06/29/2015   Arterial hypotension    Dyslipidemia 10/02/2014   Home Medication(s) Prior to Admission medications  Medication Sig Start Date End Date Taking? Authorizing Provider  acetaminophen  (TYLENOL ) 650 MG CR tablet Take 650 mg by mouth every 6 (six) hours as needed for pain.    [provider]  augmented betamethasone  dipropionate (DIPROLENE -AF) 0.05 % cream Apply topically 2 (two) times daily. 12/01/22   Lavell Bari LABOR, FNP  cephALEXin  (KEFLEX ) 500 MG capsule Take 1 capsule (500  mg total) by mouth 3 (three) times daily. 12/01/22   Lavell Bari LABOR, FNP  docusate sodium  (COLACE) 100 MG capsule Take 1 capsule (100 mg total) by mouth 2 (two) times daily. 09/14/21   Johnson, Clanford L, MD  doxycycline  (VIBRA -TABS) 100 MG tablet Take 1 tablet (100 mg total) by mouth 2 (two) times daily. 12/08/22   Lavell Bari A, FNP  Melatonin 10 MG TABS Take 20 mg by mouth at bedtime.    [provider]  naproxen  (NAPROSYN ) 500 MG tablet Take 1 tablet (500 mg total) by mouth 2 (two) times daily. 03/14/24   Cleotilde Rogue, MD  nicotine (NICODERM CQ - DOSED IN MG/24 HOURS) 21 mg/24hr patch Place 21 mg onto the skin daily.  Remove old patch prior to application of new patch - rotate sites Patient not taking: Reported on 12/08/2022    [provider]  NON FORMULARY Diet: Regular    [provider]  Past Surgical History Past Surgical History:  Procedure Laterality Date   ABDOMINAL HYSTERECTOMY     ANTERIOR AND POSTERIOR REPAIR  10/11/2011   Procedure: ANTERIOR (CYSTOCELE) AND POSTERIOR REPAIR (RECTOCELE);  Surgeon: Norleen LULLA Server, MD;  Location: [facility];  Service: Gynecology;  Laterality: N/A;   BREAST SURGERY     benign, right, [place], [place]   CHOLECYSTECTOMY     [place], [place]   COMPRESSION HIP SCREW Right 09/10/2021   Procedure: Open Treatment Internal Fixation of Right Hip;  Surgeon: Margrette Taft BRAVO, MD;  Location: [facility];  Service: Orthopedics;  Laterality: Right;   SALPINGOOPHORECTOMY  10/11/2011   Procedure: SALPINGO OOPHERECTOMY;  Surgeon: Norleen LULLA Server, MD;  Location: [facility];  Service: Gynecology;  Laterality: Right;   TONSILLECTOMY     VAGINAL HYSTERECTOMY  10/11/2011   Procedure: HYSTERECTOMY VAGINAL;  Surgeon: Norleen LULLA Server, MD;  Location: [facility];  Service: Gynecology;  Laterality: N/A;   Family History Family History   Problem Relation Age of Onset   Heart disease Sister    Diabetes Brother    Heart disease Brother    Cancer Daughter        throat    Pneumonia Daughter    Heart attack Son    GI problems Son     Social History Social History[1] Allergies Demerol [meperidine] and Xylocaine  [lidocaine ]  Review of Systems Review of Systems  All other systems reviewed and are negative.   Physical Exam Vital Signs  I have reviewed the triage vital signs BP (!) 161/79 (BP Location: Right Arm)   Pulse 80   Temp 97.9 F (36.6 C) (Oral)   Resp 16   Ht 5' 2 (1.575 m)   Wt 47.4 kg   LMP 10/04/2011   SpO2 100%   BMI 19.13 kg/m  Physical Exam Vitals and nursing note reviewed.  Constitutional:      General: She is not in acute distress.    Appearance: She is well-developed.  HENT:     Head: Normocephalic and atraumatic.     Mouth/Throat:     Mouth: Mucous membranes are moist.  Eyes:     Pupils: Pupils are equal, round, and reactive to light.  Cardiovascular:     Rate and Rhythm: Normal rate and regular rhythm.     Heart sounds: No murmur heard. Pulmonary:     Effort: Pulmonary effort is normal. No respiratory distress.     Breath sounds: Normal breath sounds.  Abdominal:     General: Abdomen is flat.     Palpations: Abdomen is soft.     Tenderness: There is no abdominal tenderness.  Musculoskeletal:        General: No tenderness.     Right lower leg: No edema.     Left lower leg: No edema.     Comments: No midline C, T, L-spine tenderness.  No chest wall tenderness or crepitus.  Full painless range of motion at the bilateral upper extremities including the shoulders, elbows, wrists, hand and fingers, and in the bilateral lower extremities including the hips, knees, ankle, toes.  No focal bony tenderness, injury or deformity.  Skin:    General: Skin is warm and dry.  Neurological:     General: No focal deficit present.     Mental Status: She is alert. Mental status is at  baseline.     Comments: Oriented to self, states she is in [place] or [place], does not know the year or why she is  here.  Psychiatric:        Mood and Affect: Mood normal.        Behavior: Behavior normal.     ED Results and Treatments Labs (all labs ordered are listed, but only abnormal results are displayed) Labs Reviewed  COMPREHENSIVE METABOLIC PANEL WITH GFR - Abnormal; Notable for the following components:      Result Value   Sodium 134 (*)    Glucose, Bld 104 (*)    Total Protein 6.2 (*)    All other components within normal limits  CBC WITH DIFFERENTIAL/PLATELET - Abnormal; Notable for the following components:   WBC 12.7 (*)    Neutro Abs 11.0 (*)    All other components within normal limits  URINALYSIS, W/ REFLEX TO CULTURE (INFECTION SUSPECTED) - Abnormal; Notable for the following components:   APPearance HAZY (*)    Nitrite POSITIVE (*)    Leukocytes,Ua TRACE (*)    Bacteria, UA MANY (*)    All other components within normal limits  URINE CULTURE                                                                                                                          Radiology CT Head Wo Contrast Result Date: 04/07/2024 EXAM: CT HEAD AND CERVICAL SPINE 04/07/2024 08:35:00 PM TECHNIQUE: CT of the head and cervical spine was performed without the administration of intravenous contrast. Multiplanar reformatted images are provided for review. Automated exposure control, iterative reconstruction, and/or weight based adjustment of the mA/kV was utilized to reduce the radiation dose to as low as reasonably achievable. COMPARISON: Cervical spine CT and head CT, both 09/08/2021. CLINICAL HISTORY: Head trauma, minor (Age >= 65y). The patient had multiple falls at home today. FINDINGS: CT HEAD BRAIN AND VENTRICLES: No acute intracranial hemorrhage. No mass effect or midline shift. No abnormal extra-axial fluid collection. No evidence of acute infarct. No hydrocephalus. There  is mild global atrophy and mild small vessel disease of the cerebral white matter for age. Scattered calcifications in the siphons. No hyperdense vessel is seen. ORBITS: No acute abnormality. SINUSES AND MASTOIDS: No acute abnormality. SOFT TISSUES AND SKULL: No acute soft tissue abnormality. There are wax impactions in both external auditory canals. No depressed skull fracture or scalp hematoma is seen. CT CERVICAL SPINE BONES AND ALIGNMENT: No acute fracture or traumatic malalignment. There is minimal chronic degenerative anterolisthesis again at C7-T1 and T1-T2, but there is no traumatic or further listhesis. Bone on bone anterior atlanto-dental joint space loss and osteophytes are again shown. Previously noted fractures of the posterior C2 body and right lateral mass, and left C2 pedicle and facet have healed in the interval. There is osteopenia without evidence of acute fractures or focal pathologic process. DEGENERATIVE CHANGES: The discs are normal in height at C2-C3, C4-C5, and C7-T1, moderately degenerated at remaining levels with bidirectional endplate osteophytes at C5-C6 and C6-C7 encroaching on the thecal sac but no more than previously. There are facet  joint and uncinate spurs with C2-C3 mild right foraminal stenosis, with mild to moderate foraminal stenosis bilaterally at C5-C6. SOFT TISSUES: No prevertebral soft tissue swelling. There are multiple tiny stones in the right parotid gland. There is no laryngeal mass. There is a heterogeneous 2.4 cm mass of the lower pole of the left lobe of the thyroid  and a densely calcified 1.5 cm left thyroid  nodule just above this. Both are similar to chest CT 08/11/2021. No follow-up imaging recommended considering advanced age. No acute upper thoracic findings. IMPRESSION: 1. No acute intracranial CT findings or depressed skull fractures. 2. No acute fracture or traumatic malalignment of the cervical spine. 3. Osteopenia and degenerative change. 4. . Atrophy and  small vessel changes in the brain. Electronically signed by: Francis Quam MD 04/07/2024 09:06 PM EST RP Workstation: [id number]   CT Cervical Spine Wo Contrast Result Date: 04/07/2024 EXAM: CT HEAD AND CERVICAL SPINE 04/07/2024 08:35:00 PM TECHNIQUE: CT of the head and cervical spine was performed without the administration of intravenous contrast. Multiplanar reformatted images are provided for review. Automated exposure control, iterative reconstruction, and/or weight based adjustment of the mA/kV was utilized to reduce the radiation dose to as low as reasonably achievable. COMPARISON: Cervical spine CT and head CT, both 09/08/2021. CLINICAL HISTORY: Head trauma, minor (Age >= 65y). The patient had multiple falls at home today. FINDINGS: CT HEAD BRAIN AND VENTRICLES: No acute intracranial hemorrhage. No mass effect or midline shift. No abnormal extra-axial fluid collection. No evidence of acute infarct. No hydrocephalus. There is mild global atrophy and mild small vessel disease of the cerebral white matter for age. Scattered calcifications in the siphons. No hyperdense vessel is seen. ORBITS: No acute abnormality. SINUSES AND MASTOIDS: No acute abnormality. SOFT TISSUES AND SKULL: No acute soft tissue abnormality. There are wax impactions in both external auditory canals. No depressed skull fracture or scalp hematoma is seen. CT CERVICAL SPINE BONES AND ALIGNMENT: No acute fracture or traumatic malalignment. There is minimal chronic degenerative anterolisthesis again at C7-T1 and T1-T2, but there is no traumatic or further listhesis. Bone on bone anterior atlanto-dental joint space loss and osteophytes are again shown. Previously noted fractures of the posterior C2 body and right lateral mass, and left C2 pedicle and facet have healed in the interval. There is osteopenia without evidence of acute fractures or focal pathologic process. DEGENERATIVE CHANGES: The discs are normal in height at C2-C3, C4-C5, and  C7-T1, moderately degenerated at remaining levels with bidirectional endplate osteophytes at C5-C6 and C6-C7 encroaching on the thecal sac but no more than previously. There are facet joint and uncinate spurs with C2-C3 mild right foraminal stenosis, with mild to moderate foraminal stenosis bilaterally at C5-C6. SOFT TISSUES: No prevertebral soft tissue swelling. There are multiple tiny stones in the right parotid gland. There is no laryngeal mass. There is a heterogeneous 2.4 cm mass of the lower pole of the left lobe of the thyroid  and a densely calcified 1.5 cm left thyroid  nodule just above this. Both are similar to chest CT 08/11/2021. No follow-up imaging recommended considering advanced age. No acute upper thoracic findings. IMPRESSION: 1. No acute intracranial CT findings or depressed skull fractures. 2. No acute fracture or traumatic malalignment of the cervical spine. 3. Osteopenia and degenerative change. 4. . Atrophy and small vessel changes in the brain. Electronically signed by: Francis Quam MD 04/07/2024 09:06 PM EST RP Workstation: [id number]    Pertinent labs & imaging results that were available during my care of the  patient were reviewed by me and considered in my medical decision making (see MDM for details).  Medications Ordered in ED Medications  enoxaparin  (LOVENOX ) injection 40 mg (has no administration in time range)  acetaminophen  (TYLENOL ) tablet 500 mg (has no administration in time range)  prochlorperazine  (COMPAZINE ) injection 5 mg (has no administration in time range)  melatonin tablet 6 mg (has no administration in time range)  0.9 %  sodium chloride  infusion (has no administration in time range)  polyethylene glycol (MIRALAX  / GLYCOLAX ) packet 17 g (has no administration in time range)  cefTRIAXone  (ROCEPHIN ) 1 g in sodium chloride  0.9 % 100 mL IVPB (0 g Intravenous Stopped 04/07/24 2233)                                                                                                                                      Procedures Procedures  (including critical care time)  Medical Decision Making / ED Course   MDM:  89 year old presenting to the emergency department with falls at home, possible behavior change  Patient has chronic dementia and unable to provide much history but overall well-appearing with normal vital signs and no signs of injury on exam.  Given recent falls will obtain CT head and cervical spine.  Will also obtain testing to evaluate for metabolic abnormality.  Will check urinalysis although patient not septic or febrile.  Will reassess.  Clinical Course as of 04/07/24 2335  [place] Apr 07, 2024  2334 Urinalysis does show some evidence of UTI with leukocytes and positive nitrites.  Discussed with patient's son reports that patient has been more unsteady, more confused than typical for although she does seem to have likely dementia.  He is a primary caregiver and is worried about her coming home given her weakness and frequent falls.  Discussed with Dr. Shona who will admit patient for UTI and weakness. [WS]    Clinical Course User Index [WS] Francesca Elsie CROME, MD     Additional history obtained: -Additional history obtained from family and ems -External records from outside source obtained and reviewed including: Chart review including previous notes, labs, imaging, consultation notes including prior notes    Lab Tests: -I ordered, reviewed, and interpreted labs.   The pertinent results include:   Labs Reviewed  COMPREHENSIVE METABOLIC PANEL WITH GFR - Abnormal; Notable for the following components:      Result Value   Sodium 134 (*)    Glucose, Bld 104 (*)    Total Protein 6.2 (*)    All other components within normal limits  CBC WITH DIFFERENTIAL/PLATELET - Abnormal; Notable for the following components:   WBC 12.7 (*)    Neutro Abs 11.0 (*)    All other components within normal limits  URINALYSIS, W/ REFLEX TO CULTURE  (INFECTION SUSPECTED) - Abnormal; Notable for the following components:   APPearance HAZY (*)    Nitrite POSITIVE (*)  Leukocytes,Ua TRACE (*)    Bacteria, UA MANY (*)    All other components within normal limits  URINE CULTURE    Notable for mild leukocytosis, UTI     Imaging Studies ordered: I ordered imaging studies including CT head and cervical spine  On my interpretation imaging demonstrates no acute injury I independently visualized and interpreted imaging. I agree with the radiologist interpretation   Medicines ordered and prescription drug management: Meds ordered this encounter  Medications   cefTRIAXone  (ROCEPHIN ) 1 g in sodium chloride  0.9 % 100 mL IVPB    Antibiotic Indication::   UTI   enoxaparin  (LOVENOX ) injection 40 mg   acetaminophen  (TYLENOL ) tablet 500 mg   prochlorperazine  (COMPAZINE ) injection 5 mg   melatonin tablet 6 mg   DISCONTD: polyethylene glycol (MIRALAX  / GLYCOLAX ) packet 17 g   0.9 %  sodium chloride  infusion   polyethylene glycol (MIRALAX  / GLYCOLAX ) packet 17 g    -I have reviewed the patients home medicines and have made adjustments as needed   Consultations Obtained: I requested consultation with the hospitalist,  and discussed lab and imaging findings as well as pertinent plan - they recommend: admission   Cardiac Monitoring: The patient was maintained on a cardiac monitor.  I personally viewed and interpreted the cardiac monitored which showed an underlying rhythm of: NSR  Reevaluation: After the interventions noted above, I reevaluated the patient and found that their symptoms have improved  Co morbidities that complicate the patient evaluation  Past Medical History:  Diagnosis Date   Anginal pain    Arthritis    Cancer (HCC)    skin cancer on her left eye    Mitral valve prolapse    PONV (postoperative nausea and vomiting)       Dispostion: Disposition decision including need for hospitalization was considered, and  patient admitted to the hospital.    Final Clinical Impression(s) / ED Diagnoses Final diagnoses:  Acute cystitis without hematuria  Acute metabolic encephalopathy     This chart was dictated using voice recognition software.  Despite best efforts to proofread,  errors can occur which can change the documentation meaning.     [1]  Social History Tobacco Use   Smoking status: Former   Smokeless tobacco: Never  Vaping Use   Vaping status: Never Used  Substance Use Topics   Alcohol  use: No   Drug use: No     Francesca Elsie CROME, MD 04/07/24 2335  "

## 2024-04-07 NOTE — ED Notes (Signed)
 Per son pt has been urinating in strange places. This am she fell outside while trying to urinate in dog bowl in dog lot.

## 2024-04-08 DIAGNOSIS — N3000 Acute cystitis without hematuria: Secondary | ICD-10-CM | POA: Diagnosis not present

## 2024-04-08 LAB — CBC WITH DIFFERENTIAL/PLATELET
Abs Immature Granulocytes: 0.02 K/uL (ref 0.00–0.07)
Basophils Absolute: 0 K/uL (ref 0.0–0.1)
Basophils Relative: 0 %
Eosinophils Absolute: 0.1 K/uL (ref 0.0–0.5)
Eosinophils Relative: 1 %
HCT: 37.4 % (ref 36.0–46.0)
Hemoglobin: 12.4 g/dL (ref 12.0–15.0)
Immature Granulocytes: 0 %
Lymphocytes Relative: 28 %
Lymphs Abs: 2.4 K/uL (ref 0.7–4.0)
MCH: 30.6 pg (ref 26.0–34.0)
MCHC: 33.2 g/dL (ref 30.0–36.0)
MCV: 92.3 fL (ref 80.0–100.0)
Monocytes Absolute: 1 K/uL (ref 0.1–1.0)
Monocytes Relative: 11 %
Neutro Abs: 5.2 K/uL (ref 1.7–7.7)
Neutrophils Relative %: 60 %
Platelets: 309 K/uL (ref 150–400)
RBC: 4.05 MIL/uL (ref 3.87–5.11)
RDW: 12.3 % (ref 11.5–15.5)
WBC: 8.7 K/uL (ref 4.0–10.5)
nRBC: 0 % (ref 0.0–0.2)

## 2024-04-08 LAB — BASIC METABOLIC PANEL WITH GFR
Anion gap: 4 — ABNORMAL LOW (ref 5–15)
BUN: 15 mg/dL (ref 8–23)
CO2: 30 mmol/L (ref 22–32)
Calcium: 9 mg/dL (ref 8.9–10.3)
Chloride: 101 mmol/L (ref 98–111)
Creatinine, Ser: 0.63 mg/dL (ref 0.44–1.00)
GFR, Estimated: >60 mL/min
Glucose, Bld: 85 mg/dL (ref 70–99)
Potassium: 3.6 mmol/L (ref 3.5–5.1)
Sodium: 135 mmol/L (ref 135–145)

## 2024-04-08 LAB — MAGNESIUM: Magnesium: 2.2 mg/dL (ref 1.7–2.4)

## 2024-04-08 LAB — PHOSPHORUS: Phosphorus: 2.6 mg/dL (ref 2.5–4.6)

## 2024-04-08 MED ORDER — SODIUM CHLORIDE 0.9 % IV SOLN
2.0000 g | INTRAVENOUS | Status: DC
  Administered 2024-04-08 – 2024-04-09 (×2): 2 g via INTRAVENOUS
  Filled 2024-04-08 (×2): qty 20

## 2024-04-08 MED ORDER — ORAL CARE MOUTH RINSE: 15.0000 mL | OROMUCOSAL | Status: DC | PRN

## 2024-04-08 MED ORDER — INFLUENZA VAC SPLIT HIGH-DOSE 0.5 ML IM SUSY: 0.5000 mL | PREFILLED_SYRINGE | INTRAMUSCULAR | Status: DC | PRN

## 2024-04-08 MED ORDER — ENSURE PLUS HIGH PROTEIN PO LIQD: 237.0000 mL | Freq: Two times a day (BID) | ORAL | Status: DC

## 2024-04-08 NOTE — Plan of Care (Signed)
 This patient remains on [facility]300 as of time of writing. The patient still requires a 1:1 recruitment consultant. The patient's admission profile is completed overnight by this RN.   Problem: Education: Goal: Knowledge of General Education information will improve Description: Including pain rating scale, medication(s)/side effects and non-pharmacologic comfort measures Outcome: Progressing   Problem: Health Behavior/Discharge Planning: Goal: Ability to manage health-related needs will improve Outcome: Progressing   Problem: Clinical Measurements: Goal: Ability to maintain clinical measurements within normal limits will improve Outcome: Progressing Goal: Will remain free from infection Outcome: Progressing Goal: Diagnostic test results will improve Outcome: Progressing Goal: Respiratory complications will improve Outcome: Progressing Goal: Cardiovascular complication will be avoided Outcome: Progressing   Problem: Activity: Goal: Risk for activity intolerance will decrease Outcome: Progressing   Problem: Nutrition: Goal: Adequate nutrition will be maintained Outcome: Progressing   Problem: Coping: Goal: Level of anxiety will decrease Outcome: Progressing   Problem: Elimination: Goal: Will not experience complications related to bowel motility Outcome: Progressing Goal: Will not experience complications related to urinary retention Outcome: Progressing   Problem: Pain Managment: Goal: General experience of comfort will improve and/or be controlled Outcome: Progressing   Problem: Safety: Goal: Ability to remain free from injury will improve Outcome: Progressing   Problem: Skin Integrity: Goal: Risk for impaired skin integrity will decrease Outcome: Progressing

## 2024-04-08 NOTE — Evaluation (Signed)
 Physical Therapy Evaluation Patient Details Name: Lauren Bruce MRN: [id number] DOB: [birth year] Today's Date: 04/08/2024  History of Present Illness  Lauren Bruce is a 89 y.o. female with medical history significant for falls which resulted in vertebral compression fracture T11 and right hip fracture in 2022, sent to the ER due to progressive generalized weakness, multiple falls today at home, and confusion.  She is alert and oriented to self and unable to provide a history.   Clinical Impression  Patient demonstrates good return for sitting up at bedside, transferring to chair without AD and when using RW, tolerated ambulating in room, hallway without loss of balance using RW and limited mostly due to fatigue. Patient tolerated sitting up in chair after therapy. Patient will benefit from continued skilled physical therapy in hospital and recommended venue below to increase strength, balance, endurance for safe ADLs and gait.          If plan is discharge home, recommend the following: A little help with walking and/or transfers;A little help with bathing/dressing/bathroom;Help with stairs or ramp for entrance;Assist for transportation;Assistance with cooking/housework   Can travel by private vehicle        Equipment Recommendations None recommended by PT  Recommendations for Other Services       Functional Status Assessment Patient has had a recent decline in their functional status and demonstrates the ability to make significant improvements in function in a reasonable and predictable amount of time.     Precautions / Restrictions Precautions Precautions: Fall Recall of Precautions/Restrictions: Impaired Restrictions Weight Bearing Restrictions Per Provider Order: No      Mobility  Bed Mobility Overal bed mobility: Modified Independent                  Transfers Overall transfer level: Needs assistance   Transfers: Sit to/from Stand, Bed to  chair/wheelchair/BSC Sit to Stand: Supervision   Step pivot transfers: Supervision       General transfer comment: fair/good return for trasnferring to chair without AD and using RW    Ambulation/Gait Ambulation/Gait assistance: Contact guard assist, Supervision Gait Distance (Feet): 80 Feet Assistive device: Rolling walker (2 wheels) Gait Pattern/deviations: Decreased step length - left, Decreased stance time - right, Decreased stride length Gait velocity: decreased     General Gait Details: slightly labored movment with good return for ambulating in room, hallway without loss of balance, limited mostly due to fatigue  Stairs            Wheelchair Mobility     Tilt Bed    Modified Rankin (Stroke Patients Only)       Balance Overall balance assessment: Needs assistance Sitting-balance support: Feet supported, No upper extremity supported Sitting balance-Leahy Scale: Fair Sitting balance - Comments: fair/good seated at EOB   Standing balance support: During functional activity, No upper extremity supported Standing balance-Leahy Scale: Poor Standing balance comment: fair/poor, fair/good using RW                             Pertinent Vitals/Pain Pain Assessment Pain Assessment: No/denies pain    Home Living Family/patient expects to be discharged to:: Private residence Living Arrangements: Spouse/significant other Available Help at Discharge: Family Type of Home: House Home Access: Stairs to enter Entrance Stairs-Rails: Right;Left;Can reach both Secretary/administrator of Steps: 4   Home Layout: One level Home Equipment: Agricultural Consultant (2 wheels);BSC/3in1;Shower seat;Cane - single point Additional Comments: Information is per chart review.  Pt is a poor historian today. No family present to provide information.    Prior Function Prior Level of Function : Patient poor historian/Family not available             Mobility Comments: household  ambulation using RW, per patient ADLs Comments: Pt is a poor historian. Reports need for assitance.     Extremity/Trunk Assessment   Upper Extremity Assessment Upper Extremity Assessment: Defer to OT evaluation    Lower Extremity Assessment Lower Extremity Assessment: Generalized weakness    Cervical / Trunk Assessment Cervical / Trunk Assessment: Normal  Communication   Communication Communication: No apparent difficulties    Cognition Arousal: Alert Behavior During Therapy: WFL for tasks assessed/performed                             Following commands: Intact       Cueing Cueing Techniques: Verbal cues, Tactile cues, Gestural cues     General Comments      Exercises     Assessment/Plan    PT Assessment Patient needs continued PT services  PT Problem List Decreased strength;Decreased activity tolerance;Decreased balance;Decreased mobility       PT Treatment Interventions DME instruction;Gait training;Stair training;Functional mobility training;Therapeutic activities;Therapeutic exercise;Balance training;Patient/family education    PT Goals (Current goals can be found in the Care Plan section)  Acute Rehab PT Goals Patient Stated Goal: return home PT Goal Formulation: With patient Time For Goal Achievement: 04/12/24 Potential to Achieve Goals: Good    Frequency Min 3X/week     Co-evaluation PT/OT/SLP Co-Evaluation/Treatment: Yes Reason for Co-Treatment: To address functional/ADL transfers PT goals addressed during session: Mobility/safety with mobility;Balance;Proper use of DME         AM-PAC PT 6 Clicks Mobility  Outcome Measure Help needed turning from your back to your side while in a flat bed without using bedrails?: None Help needed moving from lying on your back to sitting on the side of a flat bed without using bedrails?: None Help needed moving to and from a bed to a chair (including a wheelchair)?: A Little Help needed  standing up from a chair using your arms (e.g., wheelchair or bedside chair)?: A Little Help needed to walk in hospital room?: A Little Help needed climbing 3-5 steps with a railing? : A Lot 6 Click Score: 19    End of Session   Activity Tolerance: Patient tolerated treatment well;Patient limited by fatigue Patient left: in chair;with call bell/phone within reach;with chair alarm set Nurse Communication: Mobility status PT Visit Diagnosis: Unsteadiness on feet (R26.81);Other abnormalities of gait and mobility (R26.89);Muscle weakness (generalized) (M62.81)    Time: 9154-9141 PT Time Calculation (min) (ACUTE ONLY): 13 min   Charges:   PT Evaluation $PT Eval Low Complexity: 1 Low PT Treatments $Therapeutic Activity: 8-22 mins PT General Charges $$ ACUTE PT VISIT: 1 Visit         3:22 PM, 04/08/2024 Lynwood Music, MPT Physical Therapist with [facility] [phone] office [phone] mobile phone

## 2024-04-08 NOTE — Plan of Care (Signed)
" °  Problem: Acute Rehab PT Goals(only PT should resolve) Goal: Pt Will Go Supine/Side To Sit Outcome: Progressing Flowsheets (Taken 04/08/2024 1523) Pt will go Supine/Side to Sit: Independently Goal: Patient Will Transfer Sit To/From Stand Outcome: Progressing Flowsheets (Taken 04/08/2024 1523) Patient will transfer sit to/from stand:  with modified independence  with supervision Goal: Pt Will Transfer Bed To Chair/Chair To Bed Outcome: Progressing Flowsheets (Taken 04/08/2024 1523) Pt will Transfer Bed to Chair/Chair to Bed:  with modified independence  with supervision Goal: Pt Will Ambulate Outcome: Progressing Flowsheets (Taken 04/08/2024 1523) Pt will Ambulate:  100 feet  with modified independence  with supervision  with rolling walker   3:24 PM, 04/08/2024 Lynwood Music, MPT Physical Therapist with [facility] [phone] office [phone] mobile phone  "

## 2024-04-08 NOTE — Progress Notes (Signed)
 [facility] PROGRESS NOTE  Lauren Bruce (DOB: 1933/12/23) FMW:[id number] PCP: Dettinger, Fonda LABOR, MD   Brief Narrative: Lauren Bruce is a 89 year old female with a history of prior falls complicated by T11 vertebral compression fracture and right hip fracture (2022) who presented to the [facility] 1/4 after a day of progressive generalized weakness, multiple falls at home, and acute confusion. On arrival, she was afebrile and hemodynamically stable but oriented only to self and unable to provide a reliable history.  Initial evaluation was notable for leukocytosis (WBC 12.7 K) and mild hyponatremia (Na 134). Urinalysis was positive for pyuria and nitrites, consistent with a presumptive urinary tract infection; urine culture was obtained and is pending. She received IV ceftriaxone  and was admitted to the hospitalist service for further management of acute metabolic encephalopathy due to UTI. Given her significant generalized weakness and recent falls, PT/OT evaluations have been ordered, and fall precautions remain in place.  Subjective: Still quite confused but conversant and cooperative. Needs frequent redirection. Does think she has some discomfort with urination but feels well otherwise.   Objective: BP 132/66 (BP Location: Right Arm)   Pulse 71   Temp 98.5 F (36.9 C) (Oral)   Resp 16   Ht 5' 2 (1.575 m)   Wt 47.4 kg   LMP 10/04/2011   SpO2 95%   BMI 19.13 kg/m   Gen: No distress, elderly female Pulm: Clear, nonlabored  CV: RRR, no MRG, no edema GI: Soft, NT, ND, +BS  Neuro: Alert and disoriented. No new focal deficits. Ext: Warm, no deformities   Assessment & Plan: Acute metabolic encephalopathy due to UTI:  - Delirium precautions - Use sitter if needed.  - Continue abx, empirically ceftriaxone , Cx pending.   Generalized weakness:  - PT/OT consulted, anticipate improvement in fall risk with continued strengthening and clearing of encephalopathy.   Lauren KATHEE Come,  MD [facility] [email or web address] 04/08/2024, 4:51 PM

## 2024-04-08 NOTE — TOC Initial Note (Signed)
 Transition of Care [facility]) - Initial/Assessment Note    Patient Details  Name: Lauren Bruce MRN: [id number] Date of Birth: [birth year]  [facility] CM/SW Contact:    Lucie Lunger, LCSWA Phone Number: 04/08/2024, 2:06 PM  Clinical Narrative:                 CSW updated that that PT and OT are recommending [facility] services for pt. CSW spoke with pts son Marinda who states that pts spouse can be removed as emergency contact as he passed away 4 years ago. Marinda states that he and pt live together. Pts son assists with all ADLs. CSW spoke with Marinda about [facility] being arranged. He is interested in this and does not have an agency preference at this time. CSW to send out [facility] referral in the HUB. TOC to follow.   Expected Discharge Plan: Home w Home Health Services Barriers to Discharge: Continued Medical Work up   Patient Goals and CMS Choice Patient states their goals for this hospitalization and ongoing recovery are:: return home with [facility] CMS Medicare.gov Compare Post Acute Care list provided to:: Patient Choice offered to / list presented to : Patient      Expected Discharge Plan and Services In-house Referral: Clinical Social Work Discharge Planning Services: CM Consult Post Acute Care Choice: Home Health Living arrangements for the past 2 months: Single Family Home                                      Prior Living Arrangements/Services Living arrangements for the past 2 months: Single Family Home Lives with:: Adult Children Patient language and need for interpreter reviewed:: Yes Do you feel safe going back to the place where you live?: Yes      Need for Family Participation in Patient Care: Yes (Comment) Care giver support system in place?: Yes (comment)   Criminal Activity/Legal Involvement Pertinent to Current Situation/Hospitalization: No - Comment as needed  Activities of Daily Living      Permission Sought/Granted                  Emotional  Assessment Appearance:: Appears stated age       Alcohol  / Substance Use: Not Applicable Psych Involvement: No (comment)  Admission diagnosis:  UTI (urinary tract infection) [N39.0] Patient Active Problem List   Diagnosis Date Noted   UTI (urinary tract infection) 04/07/2024   Neurodegenerative cognitive impairment 09/15/2021   Moderate protein malnutrition 09/15/2021   Aortic atherosclerosis 09/15/2021   Acute blood loss anemia 09/15/2021   Chronic constipation 09/15/2021   Sundowning 09/10/2021   C2 cervical fracture (HCC) 09/10/2021   DNR (do not resuscitate) 09/09/2021   Closed fracture of right hip (HCC) 09/08/2021   GERD (gastroesophageal reflux disease) 09/08/2021   Acute respiratory failure with hypoxia (HCC) 09/08/2021   Insomnia 02/12/2019   Fall 06/29/2015   Arterial hypotension    Dyslipidemia 10/02/2014   PCP:  Dettinger, Fonda LABOR, MD Pharmacy:   [facility] [phone] - [place], [place] - [address] [place] [address] Phone: [phone] Fax: [phone]  [facility] [facility] [place], [place] - [address] [place] [address] Phone: [phone] Fax: [phone]  [facility] [place], [place] - [address] [place] [address] Phone: [phone] Fax: [phone]  Social Drivers of Health (SDOH) Social History: SDOH Screenings   Depression (PHQ2-9): High Risk (12/01/2022)  Social Connections: Unknown (08/16/2021)   Received from [facility]  Tobacco Use: Medium Risk (04/07/2024)   SDOH Interventions:     Readmission Risk Interventions     No data to display

## 2024-04-08 NOTE — Evaluation (Signed)
 Occupational Therapy Evaluation Patient Details Name: Lauren Bruce MRN: [id number] DOB: [birth year] Today's Date: 04/08/2024   History of Present Illness   Lauren Bruce is a 89 y.o. female with medical history significant for falls which resulted in vertebral compression fracture T11 and right hip fracture in 2022, sent to the ER due to progressive generalized weakness, multiple falls today at home, and confusion.  She is alert and oriented to self and unable to provide a history. (per DO)     Clinical Impressions Pt agreeable to OT and PT co-evaluation. Pt oriented only to self without any family present to provide additional information. Pt at level of supervision for ADL's and functional mobility with RW. B UE generally weak but pt able to use functionally well. Pt left in the chair with call bell within reach and chair alarm set. Pt will benefit from continued OT in the hospital to increase strength, balance, and endurance for safe ADL's.        If plan is discharge home, recommend the following:   A little help with walking and/or transfers;A little help with bathing/dressing/bathroom;Assistance with cooking/housework;Assist for transportation;Help with stairs or ramp for entrance;Direct supervision/assist for medications management     Functional Status Assessment   Patient has had a recent decline in their functional status and demonstrates the ability to make significant improvements in function in a reasonable and predictable amount of time.     Equipment Recommendations   None recommended by OT            Precautions/Restrictions   Precautions Precautions: Fall Recall of Precautions/Restrictions: Impaired Restrictions Weight Bearing Restrictions Per Provider Order: No     Mobility Bed Mobility Overal bed mobility: Modified Independent                  Transfers Overall transfer level: Needs assistance   Transfers: Sit to/from Stand, Bed to  chair/wheelchair/BSC Sit to Stand: Supervision     Step pivot transfers: Supervision     General transfer comment: EOB to chair without AD      Balance Overall balance assessment: Mild deficits observed, not formally tested                                         ADL either performed or assessed with clinical judgement   ADL Overall ADL's : Needs assistance/impaired     Grooming: Supervision/safety;Standing   Upper Body Bathing: Modified independent;Set up;Sitting   Lower Body Bathing: Modified independent;Sitting/lateral leans;Set up   Upper Body Dressing : Modified independent;Sitting;Set up   Lower Body Dressing: Modified independent;Set up;Sitting/lateral leans   Toilet Transfer: Supervision/safety;Stand-pivot;Rolling walker (2 wheels);Ambulation Toilet Transfer Details (indicate cue type and reason): EOB to chair without AD; Ambulation in hall with RW. Toileting- Clothing Manipulation and Hygiene: Modified independent;Set up;Sitting/lateral lean       Functional mobility during ADLs: Supervision/safety;Rolling walker (2 wheels)       Vision Baseline Vision/History: 1 Wears glasses Ability to See in Adequate Light: 0 Adequate Patient Visual Report: No change from baseline Vision Assessment?: No apparent visual deficits     Perception Perception: Not tested       Praxis Praxis: Not tested       Pertinent Vitals/Pain Pain Assessment Pain Assessment: No/denies pain     Extremity/Trunk Assessment Upper Extremity Assessment Upper Extremity Assessment: Generalized weakness (4-/5 grossly)   Lower Extremity Assessment  Lower Extremity Assessment: Defer to PT evaluation   Cervical / Trunk Assessment Cervical / Trunk Assessment: Normal   Communication Communication Communication: No apparent difficulties   Cognition Arousal: Alert Behavior During Therapy: WFL for tasks assessed/performed Cognition: No family/caregiver present to  determine baseline, Cognition impaired   Orientation impairments: Place, Time, Situation         OT - Cognition Comments: Oriented only to self today.                 Following commands: Intact       Cueing  General Comments   Cueing Techniques: Verbal cues;Tactile cues;Gestural cues                 Home Living Family/patient expects to be discharged to:: Private residence Living Arrangements: Spouse/significant other (Pt reports sister) Available Help at Discharge: Family Type of Home: House Home Access: Stairs to enter Secretary/administrator of Steps: 4 Entrance Stairs-Rails: Right;Left;Can reach both Home Layout: One level               Home Equipment: Agricultural Consultant (2 wheels);BSC/3in1;Shower seat;Cane - single point   Additional Comments: Information is per chart review. Pt is a poor historian today. No family present to provide information.      Prior Functioning/Environment Prior Level of Function : Patient poor historian/Family not available             Mobility Comments: Pt reported RW use. ADLs Comments: Pt is a poor historian. Reports need for assitance.    OT Problem List: Decreased strength;Decreased activity tolerance;Decreased cognition;Decreased knowledge of use of DME or AE;Impaired balance (sitting and/or standing)   OT Treatment/Interventions: Self-care/ADL training;Therapeutic exercise;DME and/or AE instruction;Therapeutic activities;Cognitive remediation/compensation;Patient/family education;Balance training      OT Goals(Current goals can be found in the care plan section)   Acute Rehab OT Goals Patient Stated Goal: Improve function. OT Goal Formulation: With patient Time For Goal Achievement: 04/22/24 Potential to Achieve Goals: Good   OT Frequency:  Min 1X/week    Co-evaluation PT/OT/SLP Co-Evaluation/Treatment: Yes Reason for Co-Treatment: To address functional/ADL transfers   OT goals addressed during  session: ADL's and self-care      AM-PAC OT 6 Clicks Daily Activity     Outcome Measure Help from another person eating meals?: None Help from another person taking care of personal grooming?: A Little Help from another person toileting, which includes using toliet, bedpan, or urinal?: A Little Help from another person bathing (including washing, rinsing, drying)?: A Little Help from another person to put on and taking off regular upper body clothing?: None Help from another person to put on and taking off regular lower body clothing?: A Little 6 Click Score: 20   End of Session Equipment Utilized During Treatment: Rolling walker (2 wheels)  Activity Tolerance: Patient tolerated treatment well Patient left: in chair;with call bell/phone within reach;with chair alarm set  OT Visit Diagnosis: Unsteadiness on feet (R26.81);Muscle weakness (generalized) (M62.81);Other symptoms and signs involving cognitive function                Time: 9153-9140 OT Time Calculation (min): 13 min Charges:  OT General Charges $OT Visit: 1 Visit OT Evaluation $OT Eval Low Complexity: 1 Low  Shahira Fiske OT, MOT  Jayson Person 04/08/2024, 9:56 AM

## 2024-04-08 NOTE — Progress Notes (Signed)
 Unable to complete the admission questions due to pt altered mental status.

## 2024-04-08 NOTE — Plan of Care (Signed)
" °  Problem: Acute Rehab OT Goals (only OT should resolve) Goal: Pt. Will Perform Grooming Flowsheets (Taken 04/08/2024 0958) Pt Will Perform Grooming:  with modified independence  standing Goal: Pt. Will Perform Toileting-Clothing Manipulation Flowsheets (Taken 04/08/2024 0958) Pt Will Perform Toileting - Clothing Manipulation and hygiene: with modified independence Goal: Pt/Caregiver Will Perform Home Exercise Program Flowsheets (Taken 04/08/2024 [phone]) Pt/caregiver will Perform Home Exercise Program:  Increased strength  Both right and left upper extremity  Independently  Tearah Saulsbury OT, MOT  "

## 2024-04-09 DIAGNOSIS — N3000 Acute cystitis without hematuria: Secondary | ICD-10-CM | POA: Diagnosis not present

## 2024-04-09 MED ORDER — CEPHALEXIN 500 MG PO CAPS: 500.0000 mg | ORAL_CAPSULE | Freq: Two times a day (BID) | ORAL | 0 refills | Status: DC

## 2024-04-09 MED ORDER — CEPHALEXIN 500 MG PO CAPS: 500.0000 mg | ORAL_CAPSULE | Freq: Two times a day (BID) | ORAL | Status: DC

## 2024-04-09 MED ORDER — SODIUM CHLORIDE 0.9 % IV SOLN: INTRAVENOUS | Status: DC | PRN

## 2024-04-09 MED ORDER — CEPHALEXIN 500 MG PO CAPS: 500.0000 mg | ORAL_CAPSULE | Freq: Two times a day (BID) | ORAL | 0 refills | Status: AC

## 2024-04-09 NOTE — Discharge Summary (Signed)
 " Physician Discharge Summary   Patient: Lauren Bruce MRN: [id number] DOB: 08-Jun-1933  Admit date:     04/07/2024  Discharge date: 04/09/2024  Discharge Physician: [name]   PCP: Dettinger, Fonda LABOR, MD   Recommendations at discharge:   Follow up urine culture after discharge.   You were treated for UTI with improvement in your white blood cell count which normalized quickly. There have been no other significant vital sign or laboratory abnormalities. The urine culture has grown E. coli which has responded to ceftriaxone  so changing to keflex  500mg  twice daily should continue to be effective for this infection. This has been sent to your pharmacy. There is not further inpatient work up or treatment required, so you are stable for discharge with home health services that have been arranged. Please follow up with PCP in 1-2 weeks for recheck. Your urine culture will be monitored and you will be contacted if change in therapy is required.   Discharge Diagnoses: Principal Problem:   UTI (urinary tract infection)  Hospital Course: Lauren Bruce is an 89 year old female with a history of prior falls complicated by T11 vertebral compression fracture and right hip fracture (2022) who presented to [facility] 1/[facility] after a day of progressive generalized weakness, multiple falls at home, and acute confusion. On arrival, she was afebrile and hemodynamically stable but oriented only to self and unable to provide a reliable history.  Initial evaluation was notable for leukocytosis (WBC 12.7 K) and mild hyponatremia (Na 134). Urinalysis was positive for pyuria and nitrites, consistent with a presumptive urinary tract infection; urine culture was obtained and is pending. She received IV ceftriaxone  and was admitted to the hospitalist service for further management of acute metabolic encephalopathy due to UTI. Given her significant generalized weakness and recent falls, PT/OT evaluations were ordered, though she  is able to ambulate freely. She remains a fall risk but does not require rehabilitation. Her overall clinical picture improved the following day and E. coli is growing on urine culture. Abx changed to keflex  empirically and urine culture will be followed. Having maximized the benefit of inpatient management, she is cleared for discharge with home health services.   Consultants: None Procedures performed: None  Disposition: Home Diet recommendation: As tolerated DISCHARGE MEDICATION: Allergies as of 04/09/2024       Reactions   Demerol [meperidine] Shortness Of Breath   Xylocaine  [lidocaine ] Shortness Of Breath   Occurred with dental procedure using Lido and Epinephrine. Tolerated Marcaine  at [facility] without problems        Medication List     TAKE these medications    cephALEXin  500 MG capsule Commonly known as: KEFLEX  Take 1 capsule (500 mg total) by mouth every 12 (twelve) hours for 5 days.   Cough Drops 2.7 MG Lozg Generic drug: Menthol  Use as directed 1 lozenge in the mouth or throat daily as needed (for cough and/or sore throat).   ibuprofen  200 MG tablet Commonly known as: ADVIL  Take 200 mg by mouth every 6 (six) hours as needed for mild pain (pain score 1-3) or moderate pain (pain score 4-6).        Contact information for follow-up providers     Dettinger, Fonda LABOR, MD Follow up.   Specialties: Family Medicine, Cardiology Contact information: [address] [place] [address] [phone]              Contact information for after-discharge care     Home Medical Care  [facility]) .   Service: Home Health Services Contact information: [phone] [place]  [address] [phone]                    Discharge Exam: Lauren Bruce   04/07/24 1829 04/07/24 2326  Weight: 52.6 kg 47.4 kg  No distress, well-appaering but elderly female Clear, nonlabored RRR, no MRG or edema Soft, Nt, ND Alert,  disoriented, no focal deficits  Condition at discharge: stable  The results of significant diagnostics from this hospitalization (including imaging, microbiology, ancillary and laboratory) are listed below for reference.   Imaging Studies: CT Head Wo Contrast Result Date: 04/07/2024 EXAM: CT HEAD AND CERVICAL SPINE 04/07/2024 08:35:00 PM TECHNIQUE: CT of the head and cervical spine was performed without the administration of intravenous contrast. Multiplanar reformatted images are provided for review. Automated exposure control, iterative reconstruction, and/or weight based adjustment of the mA/kV was utilized to reduce the radiation dose to as low as reasonably achievable. COMPARISON: Cervical spine CT and head CT, both 09/08/2021. CLINICAL HISTORY: Head trauma, minor (Age >= 65y). The patient had multiple falls at home today. FINDINGS: CT HEAD BRAIN AND VENTRICLES: No acute intracranial hemorrhage. No mass effect or midline shift. No abnormal extra-axial fluid collection. No evidence of acute infarct. No hydrocephalus. There is mild global atrophy and mild small vessel disease of the cerebral white matter for age. Scattered calcifications in the siphons. No hyperdense vessel is seen. ORBITS: No acute abnormality. SINUSES AND MASTOIDS: No acute abnormality. SOFT TISSUES AND SKULL: No acute soft tissue abnormality. There are wax impactions in both external auditory canals. No depressed skull fracture or scalp hematoma is seen. CT CERVICAL SPINE BONES AND ALIGNMENT: No acute fracture or traumatic malalignment. There is minimal chronic degenerative anterolisthesis again at C7-T1 and T1-T2, but there is no traumatic or further listhesis. Bone on bone anterior atlanto-dental joint space loss and osteophytes are again shown. Previously noted fractures of the posterior C2 body and right lateral mass, and left C2 pedicle and facet have healed in the interval. There is osteopenia without evidence of acute fractures  or focal pathologic process. DEGENERATIVE CHANGES: The discs are normal in height at C2-C3, C4-C5, and C7-T1, moderately degenerated at remaining levels with bidirectional endplate osteophytes at C5-C6 and C6-C7 encroaching on the thecal sac but no more than previously. There are facet joint and uncinate spurs with C2-C3 mild right foraminal stenosis, with mild to moderate foraminal stenosis bilaterally at C5-C6. SOFT TISSUES: No prevertebral soft tissue swelling. There are multiple tiny stones in the right parotid gland. There is no laryngeal mass. There is a heterogeneous 2.4 cm mass of the lower pole of the left lobe of the thyroid  and a densely calcified 1.5 cm left thyroid  nodule just above this. Both are similar to chest CT 08/11/2021. No follow-up imaging recommended considering advanced age. No acute upper thoracic findings. IMPRESSION: 1. No acute intracranial CT findings or depressed skull fractures. 2. No acute fracture or traumatic malalignment of the cervical spine. 3. Osteopenia and degenerative change. 4. . Atrophy and small vessel changes in the brain. Electronically signed by: Francis Quam MD 04/07/2024 09:06 PM EST RP Workstation: [id number]   CT Cervical Spine Wo Contrast Result Date: 04/07/2024 EXAM: CT HEAD AND CERVICAL SPINE 04/07/2024 08:35:00 PM TECHNIQUE: CT of the head and cervical spine was performed without the administration of intravenous contrast. Multiplanar reformatted images are provided for review. Automated exposure control, iterative reconstruction, and/or weight based adjustment  of the mA/kV was utilized to reduce the radiation dose to as low as reasonably achievable. COMPARISON: Cervical spine CT and head CT, both 09/08/2021. CLINICAL HISTORY: Head trauma, minor (Age >= 65y). The patient had multiple falls at home today. FINDINGS: CT HEAD BRAIN AND VENTRICLES: No acute intracranial hemorrhage. No mass effect or midline shift. No abnormal extra-axial fluid collection. No  evidence of acute infarct. No hydrocephalus. There is mild global atrophy and mild small vessel disease of the cerebral white matter for age. Scattered calcifications in the siphons. No hyperdense vessel is seen. ORBITS: No acute abnormality. SINUSES AND MASTOIDS: No acute abnormality. SOFT TISSUES AND SKULL: No acute soft tissue abnormality. There are wax impactions in both external auditory canals. No depressed skull fracture or scalp hematoma is seen. CT CERVICAL SPINE BONES AND ALIGNMENT: No acute fracture or traumatic malalignment. There is minimal chronic degenerative anterolisthesis again at C7-T1 and T1-T2, but there is no traumatic or further listhesis. Bone on bone anterior atlanto-dental joint space loss and osteophytes are again shown. Previously noted fractures of the posterior C2 body and right lateral mass, and left C2 pedicle and facet have healed in the interval. There is osteopenia without evidence of acute fractures or focal pathologic process. DEGENERATIVE CHANGES: The discs are normal in height at C2-C3, C4-C5, and C7-T1, moderately degenerated at remaining levels with bidirectional endplate osteophytes at C5-C6 and C6-C7 encroaching on the thecal sac but no more than previously. There are facet joint and uncinate spurs with C2-C3 mild right foraminal stenosis, with mild to moderate foraminal stenosis bilaterally at C5-C6. SOFT TISSUES: No prevertebral soft tissue swelling. There are multiple tiny stones in the right parotid gland. There is no laryngeal mass. There is a heterogeneous 2.4 cm mass of the lower pole of the left lobe of the thyroid  and a densely calcified 1.5 cm left thyroid  nodule just above this. Both are similar to chest CT 08/11/2021. No follow-up imaging recommended considering advanced age. No acute upper thoracic findings. IMPRESSION: 1. No acute intracranial CT findings or depressed skull fractures. 2. No acute fracture or traumatic malalignment of the cervical spine. 3.  Osteopenia and degenerative change. 4. . Atrophy and small vessel changes in the brain. Electronically signed by: Francis Quam MD 04/07/2024 09:06 PM EST RP Workstation: [id number]   DG Thoracic Spine 2 View Result Date: 03/14/2024 EXAM: XR Thoracic spine, 2 Views total. CLINICAL HISTORY: trauma, fall, impact mid back COMPARISON: 08/11/2021 FINDINGS: BONES: Severe compression deformity of what appears to be T11 vertebral body which appears to be chronic fracture. Posterior vertebral body alignment is within normal limits in the thoracic spine. DISCS/DEGENERATIVE CHANGES: The disc spaces are preserved. SOFT TISSUES: No prevertebral soft tissue swelling evident in the thoracic spine. The visualized lungs appear clear. IMPRESSION: 1. Severe compression deformity of the T11 vertebral body, favored to represent a chronic fracture. Electronically signed by: Lynwood Seip MD 03/14/2024 02:39 PM EST RP Workstation: [id number]    Microbiology: Results for orders placed or performed during the hospital encounter of 04/07/24  Urine Culture     Status: Abnormal (Preliminary result)   Collection Time: 04/07/24  8:58 PM   Specimen: Urine, Random  Result Value Ref Range Status   Specimen Description   Final    URINE, RANDOM Performed at [facility], [address], [place], [place] [address]    Special Requests   Final    NONE Reflexed from [id number] Performed at [facility], [address], [place], [place] [address]  Culture (A)  Final    >=100,000 COLONIES/mL ESCHERICHIA COLI SUSCEPTIBILITIES TO FOLLOW Performed at [facility], [address] [place], [place] [address]    Report Status PENDING  Incomplete    Labs: CBC: Recent Labs  Lab 04/07/24 2049 04/08/24 0524  WBC 12.7* 8.7  NEUTROABS 11.0* 5.2  HGB 13.4 12.4  HCT 40.5 37.4  MCV 93.1 92.3  PLT 298 309   Basic Metabolic Panel: Recent Labs  Lab 04/07/24 2049 04/08/24 0524  NA 134* 135  K 4.1 3.6  CL 100 101  CO2 29 30   GLUCOSE 104* 85  BUN 19 15  CREATININE 0.75 0.63  CALCIUM 9.5 9.0  MG  --  2.2  PHOS  --  2.6   Liver Function Tests: Recent Labs  Lab 04/07/24 2049  AST 17  ALT 11  ALKPHOS 76  BILITOT 0.3  PROT 6.2*  ALBUMIN 3.9   CBG: No results for input(s): GLUCAP in the last 168 hours.  Discharge time spent: greater than 30 minutes.  Signed: [name], MD [facility] 04/09/2024 "

## 2024-04-09 NOTE — TOC Transition Note (Signed)
 Transition of Care [facility]) - Discharge Note   Patient Details  Name: Lauren Bruce MRN: [id number] Date of Birth: [birth year]  [facility] CM/SW Contact:  Lucie Lunger, LCSWA Phone Number: 04/09/2024, 11:06 AM   Clinical Narrative:    CSW updated that pt may be able to D/C home today. [facility] arranged for services, they can accept for [facility] PT/OT/RN services. CSW requested that MD place [facility] orders. TOC signing off.   Final next level of care: Home w Home Health Services Barriers to Discharge: Barriers Resolved   Patient Goals and CMS Choice Patient states their goals for this hospitalization and ongoing recovery are:: return home CMS Medicare.gov Compare Post Acute Care list provided to:: Patient Represenative (must comment) Choice offered to / list presented to : Adult Children      Discharge Placement                       Discharge Plan and Services Additional resources added to the After Visit Summary for   In-house Referral: Clinical Social Work Discharge Planning Services: CM Consult Post Acute Care Choice: Home Health                    HH Arranged: RN, PT, OT [facility] Agency: [facility] Date [facility] Agency Contacted: 04/09/24   Representative spoke with at [facility] Agency: [facility]  Social Drivers of Health (SDOH) Interventions SDOH Screenings   Depression (PHQ2-9): High Risk (12/01/2022)  Social Connections: Unknown (08/16/2021)   Received from [facility]  Tobacco Use: Medium Risk (04/07/2024)     Readmission Risk Interventions     No data to display

## 2024-04-09 NOTE — Care Management Obs Status (Signed)
 MEDICARE OBSERVATION STATUS NOTIFICATION   Patient Details  Name: Lauren Bruce MRN: [id number] Date of Birth: [birth year]   Medicare Observation Status Notification Given:  Yes    Duwaine LITTIE Ada 04/09/2024, 10:46 AM

## 2024-04-10 LAB — URINE CULTURE: Culture: >=100000 — AB

## 2024-04-17 ENCOUNTER — Ambulatory Visit (INDEPENDENT_AMBULATORY_CARE_PROVIDER_SITE_OTHER): Admitting: Family Medicine

## 2024-04-17 ENCOUNTER — Encounter: Payer: Self-pay | Admitting: Family Medicine

## 2024-04-17 VITALS — BP 128/84 | HR 97 | Ht 62.0 in | Wt 102.0 lb

## 2024-04-17 DIAGNOSIS — G9341 Metabolic encephalopathy: Secondary | ICD-10-CM | POA: Diagnosis not present

## 2024-04-17 DIAGNOSIS — N3000 Acute cystitis without hematuria: Secondary | ICD-10-CM | POA: Diagnosis not present

## 2024-04-17 DIAGNOSIS — R399 Unspecified symptoms and signs involving the genitourinary system: Secondary | ICD-10-CM | POA: Diagnosis not present

## 2024-04-17 LAB — URINALYSIS, COMPLETE
Bilirubin, UA: NEGATIVE
Glucose, UA: NEGATIVE
Ketones, UA: NEGATIVE
Leukocytes,UA: NEGATIVE
Nitrite, UA: NEGATIVE
RBC, UA: NEGATIVE
Specific Gravity, UA: 1.015 (ref 1.005–1.030)
Urobilinogen, Ur: 0.2 mg/dL (ref 0.2–1.0)
pH, UA: 6 (ref 5.0–7.5)

## 2024-04-17 LAB — MICROSCOPIC EXAMINATION
Bacteria, UA: NONE SEEN
RBC, Urine: NONE SEEN /HPF (ref 0–2)
Renal Epithel, UA: NONE SEEN /HPF
Yeast, UA: NONE SEEN

## 2024-04-17 NOTE — Progress Notes (Signed)
 "  BP 128/84   Pulse 97   Ht 5' 2 (1.575 m)   Wt 102 lb (46.3 kg)   LMP 10/04/2011   SpO2 93%   BMI 18.66 kg/m    Subjective:   Patient ID: Lauren Bruce, female    DOB: 23-Oct-1933, 89 y.o.   MRN: [id number]  HPI: Lauren Bruce is a 89 y.o. female presenting on 04/17/2024 for Hospitalization Follow-up (UTI)   Discussed the use of AI scribe software for clinical note transcription with the patient, who gave verbal consent to proceed.  History of Present Illness   Lauren Bruce is an 89 year old female who presents for an ER/hospital follow-up after treatment for a urinary tract infection.  Urinary tract infection - Hospitalized from April 07, 2024, to April 09, 2024, for urinary tract infection - Urine culture grew pan-sensitive E. coli - Treated with IV ceftriaxone  during hospitalization - Discharged on cephalexin ; two doses remaining to complete course - No dysuria, hematuria, fevers, chills, or flank pain  Electrolyte abnormalities and leukocytosis - Mild leukocytosis and mild hyponatremia during hospitalization  Cognitive changes - Ongoing fluctuating confusion since a fall in the kitchen - Confusion persists but is gradually improving as strength and energy return  Gastrointestinal symptoms - Occasional heartburn - Appetite is improving - Son provides yogurt  Nutritional status and hydration - Son prepares meals three times daily, including fresh vegetables and fruits - Maintains hydration with coffee, tea, and green tea - Avoids sodas          Relevant past medical, surgical, family and social history reviewed and updated as indicated. Interim medical history since our last visit reviewed. Allergies and medications reviewed and updated.  Review of Systems  Constitutional:  Negative for chills and fever.  HENT:  Negative for congestion, ear discharge and ear pain.   Eyes:  Negative for redness and visual disturbance.  Respiratory:  Negative for chest  tightness and shortness of breath.   Cardiovascular:  Negative for chest pain and leg swelling.  Genitourinary:  Negative for decreased urine volume, difficulty urinating, dysuria, frequency and urgency.  Musculoskeletal:  Negative for back pain and gait problem.  Skin:  Negative for rash.  Neurological:  Negative for light-headedness and headaches.  Psychiatric/Behavioral:  Positive for confusion. Negative for agitation and behavioral problems.   All other systems reviewed and are negative.   Per HPI unless specifically indicated above   Allergies as of 04/17/2024       Reactions   Demerol [meperidine] Shortness Of Breath   Xylocaine  [lidocaine ] Shortness Of Breath   Occurred with dental procedure using Lido and Epinephrine. Tolerated Marcaine  at [facility] without problems        Medication List        Accurate as of April 17, 2024 10:48 AM. If you have any questions, ask your nurse or doctor.          cephALEXin  500 MG capsule Commonly known as: KEFLEX  Take 500 mg by mouth 4 (four) times daily.   Cough Drops 2.7 MG Lozg Generic drug: Menthol  Use as directed 1 lozenge in the mouth or throat daily as needed (for cough and/or sore throat).   ibuprofen  200 MG tablet Commonly known as: ADVIL  Take 200 mg by mouth every 6 (six) hours as needed for mild pain (pain score 1-3) or moderate pain (pain score 4-6).         Objective:   BP 128/84   Pulse 97  Ht 5' 2 (1.575 m)   Wt 102 lb (46.3 kg)   LMP 10/04/2011   SpO2 93%   BMI 18.66 kg/m   Wt Readings from Last 3 Encounters:  04/17/24 102 lb (46.3 kg)  04/07/24 104 lb 9.6 oz (47.4 kg)  03/14/24 115 lb 15.4 oz (52.6 kg)    Physical Exam Vitals and nursing note reviewed.  Constitutional:      Appearance: Normal appearance.  Cardiovascular:     Rate and Rhythm: Normal rate and regular rhythm.  Pulmonary:     Effort: Pulmonary effort is normal. No respiratory distress.     Breath sounds: Normal breath sounds.  No stridor. No wheezing or rhonchi.  Abdominal:     General: Abdomen is flat. Bowel sounds are normal. There is no distension.     Tenderness: There is abdominal tenderness (mild epigastric tenderness). There is no right CVA tenderness, left CVA tenderness or guarding.  Neurological:     Mental Status: She is alert.    Physical Exam   CARDIOVASCULAR: Heart regular rate and rhythm, no murmurs. ABDOMEN: Abdomen soft, non-tender except mild tenderness in epigastric region.         Assessment & Plan:   Problem List Items Addressed This Visit       Genitourinary   UTI (urinary tract infection) - Primary   Relevant Medications   cephALEXin  (KEFLEX ) 500 MG capsule   Other Relevant Orders   CBC With Diff/Platelet   CMP14+EGFR   Other Visit Diagnoses       Metabolic encephalopathy       Relevant Orders   CBC With Diff/Platelet   CMP14+EGFR     UTI symptoms       Relevant Orders   Urinalysis, Complete   Urine Culture          Urinary tract infection Recent hospitalization for UTI treated with IV ceftriaxone , now on oral cephalexin . Urine culture showed pan-sensitive E. coli. - Continue cephalexin  as prescribed. - Encouraged increased fluid intake, especially water .  Metabolic encephalopathy Acute metabolic encephalopathy secondary to UTI. Confusion persists but is stable. Fluctuations possible with age and dehydration. - Monitor confusion levels and report significant changes.  Dehydration and hyponatremia Mild hyponatremia and dehydration noted during hospitalization, currently improving with hydration. Vital signs stable. - Ordered blood work to check white blood cell count and electrolytes. - Encouraged continued hydration with water .     Urinalysis looked clear today, will run culture.     Follow up plan: Return if symptoms worsen or fail to improve.  Counseling provided for all of the vaccine components Orders Placed This Encounter  Procedures   Urine  Culture   CBC With Diff/Platelet   CMP14+EGFR   Urinalysis, Complete    Lauren Levins, MD [facility] 04/17/2024, 10:48 AM     "

## 2024-04-18 LAB — CMP14+EGFR
ALT: 8 IU/L (ref 0–32)
AST: 13 IU/L (ref 0–40)
Albumin: 3.8 g/dL (ref 3.7–4.7)
Alkaline Phosphatase: 86 IU/L (ref 48–129)
BUN/Creatinine Ratio: 14 (ref 12–28)
BUN: 12 mg/dL (ref 8–27)
Bilirubin Total: 0.2 mg/dL (ref 0.0–1.2)
CO2: 22 mmol/L (ref 20–29)
Calcium: 9.3 mg/dL (ref 8.7–10.3)
Chloride: 100 mmol/L (ref 96–106)
Creatinine, Ser: 0.83 mg/dL (ref 0.57–1.00)
Globulin, Total: 2.4 g/dL (ref 1.5–4.5)
Glucose: 112 mg/dL — ABNORMAL HIGH (ref 70–99)
Potassium: 4.3 mmol/L (ref 3.5–5.2)
Sodium: 138 mmol/L (ref 134–144)
Total Protein: 6.2 g/dL (ref 6.0–8.5)
eGFR: 68 mL/min/1.73

## 2024-04-18 LAB — CBC WITH DIFF/PLATELET
Basophils Absolute: 0 x10E3/uL (ref 0.0–0.2)
Basos: 1 %
EOS (ABSOLUTE): 0.1 x10E3/uL (ref 0.0–0.4)
Eos: 1 %
Hematocrit: 45.4 % (ref 34.0–46.6)
Hemoglobin: 14.8 g/dL (ref 11.1–15.9)
Immature Grans (Abs): 0 x10E3/uL (ref 0.0–0.1)
Immature Granulocytes: 0 %
Lymphocytes Absolute: 1.7 x10E3/uL (ref 0.7–3.1)
Lymphs: 39 %
MCH: 30.4 pg (ref 26.6–33.0)
MCHC: 32.6 g/dL (ref 31.5–35.7)
MCV: 93 fL (ref 79–97)
Monocytes Absolute: 0.5 x10E3/uL (ref 0.1–0.9)
Monocytes: 11 %
Neutrophils Absolute: 2.1 x10E3/uL (ref 1.4–7.0)
Neutrophils: 48 %
Platelets: 254 x10E3/uL (ref 150–450)
RBC: 4.87 x10E6/uL (ref 3.77–5.28)
RDW: 11.8 % (ref 11.7–15.4)
WBC: 4.3 x10E3/uL (ref 3.4–10.8)

## 2024-04-19 ENCOUNTER — Ambulatory Visit: Payer: Self-pay | Admitting: Family Medicine

## 2024-04-19 LAB — URINE CULTURE

## 2024-04-24 ENCOUNTER — Ambulatory Visit

## 2024-04-24 DIAGNOSIS — M8580 Other specified disorders of bone density and structure, unspecified site: Secondary | ICD-10-CM | POA: Diagnosis not present

## 2024-04-24 DIAGNOSIS — M479 Spondylosis, unspecified: Secondary | ICD-10-CM

## 2024-04-24 DIAGNOSIS — F05 Delirium due to known physiological condition: Secondary | ICD-10-CM | POA: Diagnosis not present

## 2024-04-24 DIAGNOSIS — I700 Atherosclerosis of aorta: Secondary | ICD-10-CM

## 2024-04-24 DIAGNOSIS — E440 Moderate protein-calorie malnutrition: Secondary | ICD-10-CM

## 2024-04-24 DIAGNOSIS — N390 Urinary tract infection, site not specified: Secondary | ICD-10-CM

## 2024-04-24 DIAGNOSIS — G9341 Metabolic encephalopathy: Secondary | ICD-10-CM | POA: Diagnosis not present

## 2024-04-24 DIAGNOSIS — I341 Nonrheumatic mitral (valve) prolapse: Secondary | ICD-10-CM

## 2024-04-24 DIAGNOSIS — G319 Degenerative disease of nervous system, unspecified: Secondary | ICD-10-CM

## 2024-04-24 DIAGNOSIS — K219 Gastro-esophageal reflux disease without esophagitis: Secondary | ICD-10-CM

## 2024-04-24 DIAGNOSIS — R296 Repeated falls: Secondary | ICD-10-CM

## 2024-04-24 DIAGNOSIS — M1990 Unspecified osteoarthritis, unspecified site: Secondary | ICD-10-CM | POA: Diagnosis not present

## 2024-04-25 ENCOUNTER — Inpatient Hospital Stay: Admitting: Nurse Practitioner
